# Patient Record
Sex: Male | Born: 1980 | Race: White | Hispanic: No | Marital: Single | Smoking: Never smoker
Health system: Southern US, Community
[De-identification: ages and names within clinical notes are randomized; demographics above are authoritative.]

## PROBLEM LIST (undated history)

## (undated) DIAGNOSIS — F419 Anxiety disorder, unspecified: Secondary | ICD-10-CM

## (undated) DIAGNOSIS — I10 Essential (primary) hypertension: Secondary | ICD-10-CM

## (undated) DIAGNOSIS — M549 Dorsalgia, unspecified: Secondary | ICD-10-CM

## (undated) DIAGNOSIS — F4310 Post-traumatic stress disorder, unspecified: Secondary | ICD-10-CM

## (undated) DIAGNOSIS — G8929 Other chronic pain: Secondary | ICD-10-CM

## (undated) DIAGNOSIS — F32A Depression, unspecified: Secondary | ICD-10-CM

## (undated) DIAGNOSIS — F329 Major depressive disorder, single episode, unspecified: Secondary | ICD-10-CM

---

## 2004-12-01 ENCOUNTER — Emergency Department: Payer: Self-pay | Admitting: Emergency Medicine

## 2005-01-19 ENCOUNTER — Emergency Department: Payer: Self-pay | Admitting: Unknown Physician Specialty

## 2005-08-10 ENCOUNTER — Emergency Department: Payer: Self-pay | Admitting: Emergency Medicine

## 2006-03-29 ENCOUNTER — Emergency Department (HOSPITAL_COMMUNITY): Admission: EM | Admit: 2006-03-29 | Discharge: 2006-03-29 | Payer: Self-pay | Admitting: Emergency Medicine

## 2007-05-03 ENCOUNTER — Emergency Department: Payer: Self-pay | Admitting: Emergency Medicine

## 2008-01-14 ENCOUNTER — Ambulatory Visit (HOSPITAL_COMMUNITY): Admission: RE | Admit: 2008-01-14 | Discharge: 2008-01-14 | Payer: Self-pay | Admitting: Family Medicine

## 2009-08-28 ENCOUNTER — Emergency Department (HOSPITAL_COMMUNITY): Admission: EM | Admit: 2009-08-28 | Discharge: 2009-08-28 | Payer: Self-pay | Admitting: Emergency Medicine

## 2009-08-30 ENCOUNTER — Emergency Department (HOSPITAL_COMMUNITY): Admission: EM | Admit: 2009-08-30 | Discharge: 2009-08-30 | Payer: Self-pay | Admitting: Emergency Medicine

## 2009-11-14 ENCOUNTER — Emergency Department (HOSPITAL_COMMUNITY): Admission: EM | Admit: 2009-11-14 | Discharge: 2009-11-14 | Payer: Self-pay | Admitting: Emergency Medicine

## 2010-06-09 ENCOUNTER — Emergency Department (HOSPITAL_COMMUNITY)
Admission: EM | Admit: 2010-06-09 | Discharge: 2010-06-10 | Disposition: A | Discharge status: Home / Self Care | Payer: No Typology Code available for payment source | Attending: Emergency Medicine | Admitting: Emergency Medicine

## 2010-06-09 DIAGNOSIS — S335XXA Sprain of ligaments of lumbar spine, initial encounter: Secondary | ICD-10-CM | POA: Insufficient documentation

## 2010-06-09 DIAGNOSIS — F988 Other specified behavioral and emotional disorders with onset usually occurring in childhood and adolescence: Secondary | ICD-10-CM | POA: Insufficient documentation

## 2010-06-09 DIAGNOSIS — M722 Plantar fascial fibromatosis: Secondary | ICD-10-CM | POA: Insufficient documentation

## 2010-06-09 DIAGNOSIS — F3289 Other specified depressive episodes: Secondary | ICD-10-CM | POA: Insufficient documentation

## 2010-06-09 DIAGNOSIS — Z79899 Other long term (current) drug therapy: Secondary | ICD-10-CM | POA: Insufficient documentation

## 2010-06-09 DIAGNOSIS — M5380 Other specified dorsopathies, site unspecified: Secondary | ICD-10-CM | POA: Insufficient documentation

## 2010-06-09 DIAGNOSIS — F329 Major depressive disorder, single episode, unspecified: Secondary | ICD-10-CM | POA: Insufficient documentation

## 2010-06-10 ENCOUNTER — Emergency Department (HOSPITAL_COMMUNITY)
Admission: EM | Admit: 2010-06-10 | Discharge: 2010-06-10 | Disposition: A | Discharge status: Home / Self Care | Payer: No Typology Code available for payment source | Attending: Emergency Medicine | Admitting: Emergency Medicine

## 2010-06-10 DIAGNOSIS — S335XXA Sprain of ligaments of lumbar spine, initial encounter: Secondary | ICD-10-CM | POA: Insufficient documentation

## 2010-06-10 DIAGNOSIS — M722 Plantar fascial fibromatosis: Secondary | ICD-10-CM | POA: Insufficient documentation

## 2010-06-10 DIAGNOSIS — Y92410 Unspecified street and highway as the place of occurrence of the external cause: Secondary | ICD-10-CM | POA: Insufficient documentation

## 2010-07-27 ENCOUNTER — Inpatient Hospital Stay (HOSPITAL_COMMUNITY)
Admission: EM | Admit: 2010-07-27 | Discharge: 2010-07-28 | DRG: 607 | Disposition: A | Discharge status: Home / Self Care | Payer: Self-pay | Attending: Internal Medicine | Admitting: Internal Medicine

## 2010-07-27 DIAGNOSIS — L255 Unspecified contact dermatitis due to plants, except food: Principal | ICD-10-CM | POA: Diagnosis present

## 2010-07-27 DIAGNOSIS — F3289 Other specified depressive episodes: Secondary | ICD-10-CM | POA: Diagnosis present

## 2010-07-27 DIAGNOSIS — T622X1A Toxic effect of other ingested (parts of) plant(s), accidental (unintentional), initial encounter: Secondary | ICD-10-CM | POA: Diagnosis present

## 2010-07-27 DIAGNOSIS — F909 Attention-deficit hyperactivity disorder, unspecified type: Secondary | ICD-10-CM | POA: Diagnosis present

## 2010-07-27 DIAGNOSIS — F329 Major depressive disorder, single episode, unspecified: Secondary | ICD-10-CM | POA: Diagnosis present

## 2010-07-27 LAB — COMPREHENSIVE METABOLIC PANEL
AST: 19 U/L (ref 0–37)
Albumin: 3.8 g/dL (ref 3.5–5.2)
Creatinine, Ser: 0.91 mg/dL (ref 0.50–1.35)
GFR calc Af Amer: >60 mL/min (ref >60)
GFR calc non Af Amer: >60 mL/min (ref >60)
Sodium: 138 mEq/L (ref 135–145)
Total Bilirubin: 0.3 mg/dL (ref 0.3–1.2)
Total Protein: 7.5 g/dL (ref 6.0–8.3)

## 2010-07-27 LAB — DIFFERENTIAL
Basophils Absolute: 0 10*3/uL (ref 0.0–0.1)
Eosinophils Absolute: 0.2 10*3/uL (ref 0.0–0.7)
Eosinophils Relative: 2 % (ref 0–5)
Lymphs Abs: 2.7 10*3/uL (ref 0.7–4.0)
Monocytes Absolute: 0.5 10*3/uL (ref 0.1–1.0)
Neutrophils Relative %: 58 % (ref 43–77)

## 2010-07-27 LAB — CBC
Hemoglobin: 14.2 g/dL (ref 13.0–17.0)
MCH: 28 pg (ref 26.0–34.0)
RBC: 5.08 MIL/uL (ref 4.22–5.81)
RDW: 13 % (ref 11.5–15.5)

## 2010-07-28 LAB — LIPID PANEL
HDL: 34 mg/dL — ABNORMAL LOW (ref >39)
LDL Cholesterol: 84 mg/dL (ref 0–99)
Triglycerides: 147 mg/dL (ref <150)

## 2010-07-28 LAB — DIFFERENTIAL
Basophils Absolute: 0 10*3/uL (ref 0.0–0.1)
Eosinophils Relative: 2 % (ref 0–5)
Lymphs Abs: 2.5 10*3/uL (ref 0.7–4.0)
Neutro Abs: 4.3 10*3/uL (ref 1.7–7.7)
Neutrophils Relative %: 57 % (ref 43–77)

## 2010-07-28 LAB — BASIC METABOLIC PANEL
BUN: 10 mg/dL (ref 6–23)
Chloride: 105 mEq/L (ref 96–112)
GFR calc non Af Amer: >60 mL/min (ref >60)
Potassium: 3.9 mEq/L (ref 3.5–5.1)

## 2010-07-28 LAB — CBC
MCH: 27.7 pg (ref 26.0–34.0)
Platelets: 211 10*3/uL (ref 150–400)
RDW: 13.2 % (ref 11.5–15.5)

## 2010-07-29 NOTE — H&P (Signed)
NAME:  Jesse Ellis, Jesse Ellis NO.:  [id number]  MEDICAL RECORD NO.:  [id number]  LOCATION:  A330                          FACILITY:  [facility]  PHYSICIAN:  Vania Rea, M.D. DATE OF BIRTH:  Jan 29, 1981  DATE OF ADMISSION:  07/27/2010 DATE OF DISCHARGE:  LH                             HISTORY & PHYSICAL   PRIMARY CARE PHYSICIAN:  Dr. Shea Evans at [facility] practice in [place].  DICTATING PHYSICIAN:  Dr. Vania Rea.  CHIEF COMPLAINT:  Swelling and redness of the face getting worse over the past 3 days.  HISTORY OF PRESENT ILLNESS:  This is a 30 year old morbidly obese Caucasian gentleman with a history of attention deficit disorder, depression, and anxiety who is cared for by his mother and who was using a "weed whacker" out in the garden about 3 days ago when some dirt flew up and hit him in the face.  Since then the patient has had progressive swelling of the right eye, scalp, and areas of his face.  This morning he started having fever and chills and came to the emergency room to be evaluated and was felt to be having a cellulitis of the face and Hospitalist Service was called to assist with management.  The patient does report there is some itching associated with the rash and swelling of his face.  The patient reports that since being in the emergency room, he seems to have been feeling better since receiving antibiotics.  PAST MEDICAL HISTORY: 1. Recurrent folliculitis. 2. Morbid obesity. 3. Depression and anxiety, questionable bipolar disorder. 4. Attention deficit hyperactivity disorder. 5. Remote history of bleeding, peptic ulcer.  MEDICATIONS: 1. Prozac 20 mg daily. 2. Klonopin 1 mg twice daily.  ALLERGIES:  DEPAKOTE caused him to put on a lot of weight, otherwise no known drug allergies.  SOCIAL HISTORY:  No history of tobacco or illicit drug use.  He used to be an alcohol abuser but after getting information from alcoholics anonymous, he  discontinued alcohol use 3 years ago.  FAMILY HISTORY:  Significant for diabetes and coronary artery disease.  REVIEW OF SYSTEMS:  Other than noted above significant only to the fact his mother says he has been progressively losing weight to dieting.  PHYSICAL EXAMINATION:  GENERAL:  Morbidly obese young Caucasian gentleman sitting up in the stretcher. VITAL SIGNS:  His temperature is 98, his pulse is 72, respiration 20, blood pressure 152/84.  He is saturating at 98% on room air. HEENT:  Eyes:  His pupils are round and equal.  Mucous membranes pink, anicteric.  He is not dehydrated. NECK:  No cervical lymphadenopathy.  No thyromegaly or carotid bruit. He has a very thick neck CHEST:  Clear to auscultation bilaterally. CARDIOVASCULAR SYSTEM:  Regular rhythm.  No murmur. ABDOMEN:  Obese, soft, nontender. EXTREMITIES:  Without edema. SKIN:  On his face he has edema and erythema of his scalp, most in the anterior scalp and patchy bases.  He has right periorbital edema and erythema and also some erythema and swelling of areas of both cheeks and the back of his head, also some erythema to the top of his back.  He also has folliculitis of the back, multiple red  areas on both legs which appeared to be in insect bites.  Also has multiple areas on his trunk which looked like welts.  He has no open wounds or ulcerations. CENTRAL NERVOUS SYSTEM:  Cranial nerves II-XII are grossly intact.  He has no focal lateralizing signs.  LABORATORY DATA:  His white count is 8.1, hemoglobin 14.2, platelets 222,000.  He has a normal differential.  His absolute eosinophil count is normal at 200.  His sodium is 138, potassium 3.7, chloride 100, CO2 31, glucose 95, BUN 10, creatinine 0.91.  His liver functions were completely normal.  His calcium is 9.7.  ASSESSMENT: 1. Questionable cellulitis of the face and scalp. 2. Possible allergic reaction of the face and scalp and his trunk. 3. Morbid obesity. 4.  Attention deficit disorder. 5. Depression.  PLAN:  We will admit this gentleman for continued antibiotic therapy. We will also give empiric treatment for allergies with PEPCID and BENADRYL in view of the fact that we have no documented fever and no leukocytosis.  Further plans will depend on the outcome of his current treatment.  This will be an observation admission for the time being.     Vania Rea, M.D.     LC/MEDQ  D:  07/28/2010  T:  07/28/2010  Job:  [id number]  cc:   Dr. Shea Evans  Electronically Signed by Vania Rea M.D. on 07/29/2010 03:24:19 AM

## 2010-07-29 NOTE — Discharge Summary (Signed)
  NAME:  Jesse Ellis, Jesse Ellis NO.:  [id number]  MEDICAL RECORD NO.:  [id number]  LOCATION:  A330                          FACILITY:  [facility]  PHYSICIAN:  Shatana Saxton L. Lendell Caprice, MDDATE OF BIRTH:  December 05, 1980  DATE OF ADMISSION:  07/27/2010 DATE OF DISCHARGE:  06/17/2012LH                              DISCHARGE SUMMARY   DISCHARGE DIAGNOSES: 1. Contact dermatitis of the face and hands. 2. Obesity 3. Attention deficit disorder.  DISCHARGE MEDICATIONS: 1. Prednisone taper over 3 weeks as directed. 2. Prozac 20 mg a day. 3. Klonopin 1 mg twice a day. 4. Benadryl 50 mg nightly as needed for sleep and itching. 5. Calamine lotion to rash as needed.  CONDITION:  Stable.  ACTIVITY:  Ad lib.  FOLLOWUP:  With his primary care physician next week.  CONSULTATIONS:  None.  DIET:  Regular.  PROCEDURES:  None.  LABORATORY DATA:  CBC normal.  Complete metabolic panel normal.  HISTORY AND HOSPITAL COURSE:  Jesse Ellis is a 30 year old white male who presented with facial swelling.  He was doing yard work using a Air cabin crew several days ago.  Some of the plant material and dirt got into his face.  He was not wearing any projective eye equipment.  He suffered no trauma to the globe.  Since then, his right face has become red, itchy, swollen.  He also developed some rash and itching on his hands and fingers.  He came to the emergency room and initially, there was concern about cellulitis.  He was started on empiric broad-spectrum antibiotics.  I have seen the patient today and he definitely has grouped vesicles, some of which are in a linear distribution, particularly on the hands and fingers.  There is no evidence of cellulitis, but this is consistent with contact dermatitis from poison ivy or poison sumac.  The antibiotics have been stopped and the patient will be started on a prednisone taper, particularly as it is involving his face. He has some erythema and induration of  the periorbital area, but no rash or abnormalities of the globe.  The rash is intensely pleuritic.  The patient can be discharged with outpatient followup.     Modell Fendrick L. Lendell Caprice, MD     CLS/MEDQ  D:  07/28/2010  T:  07/28/2010  Job:  [id number]  Electronically Signed by Crista Curb MD on 07/29/2010 10:50:43 AM

## 2010-08-03 LAB — CULTURE, BLOOD (ROUTINE X 2): Culture: NO GROWTH

## 2011-03-15 ENCOUNTER — Emergency Department (HOSPITAL_COMMUNITY): Payer: Self-pay

## 2011-03-15 ENCOUNTER — Emergency Department (HOSPITAL_COMMUNITY)
Admission: EM | Admit: 2011-03-15 | Discharge: 2011-03-15 | Disposition: A | Discharge status: Home / Self Care | Payer: Self-pay | Attending: Emergency Medicine | Admitting: Emergency Medicine

## 2011-03-15 ENCOUNTER — Encounter (HOSPITAL_COMMUNITY): Payer: Self-pay | Admitting: *Deleted

## 2011-03-15 DIAGNOSIS — R059 Cough, unspecified: Secondary | ICD-10-CM | POA: Insufficient documentation

## 2011-03-15 DIAGNOSIS — G8929 Other chronic pain: Secondary | ICD-10-CM | POA: Insufficient documentation

## 2011-03-15 DIAGNOSIS — R110 Nausea: Secondary | ICD-10-CM | POA: Insufficient documentation

## 2011-03-15 DIAGNOSIS — M549 Dorsalgia, unspecified: Secondary | ICD-10-CM | POA: Insufficient documentation

## 2011-03-15 DIAGNOSIS — Z79899 Other long term (current) drug therapy: Secondary | ICD-10-CM | POA: Insufficient documentation

## 2011-03-15 DIAGNOSIS — F4310 Post-traumatic stress disorder, unspecified: Secondary | ICD-10-CM | POA: Insufficient documentation

## 2011-03-15 DIAGNOSIS — F411 Generalized anxiety disorder: Secondary | ICD-10-CM | POA: Insufficient documentation

## 2011-03-15 DIAGNOSIS — R05 Cough: Secondary | ICD-10-CM | POA: Insufficient documentation

## 2011-03-15 DIAGNOSIS — F3289 Other specified depressive episodes: Secondary | ICD-10-CM | POA: Insufficient documentation

## 2011-03-15 DIAGNOSIS — F329 Major depressive disorder, single episode, unspecified: Secondary | ICD-10-CM | POA: Insufficient documentation

## 2011-03-15 DIAGNOSIS — J3489 Other specified disorders of nose and nasal sinuses: Secondary | ICD-10-CM | POA: Insufficient documentation

## 2011-03-15 DIAGNOSIS — B349 Viral infection, unspecified: Secondary | ICD-10-CM

## 2011-03-15 DIAGNOSIS — J029 Acute pharyngitis, unspecified: Secondary | ICD-10-CM | POA: Insufficient documentation

## 2011-03-15 HISTORY — DX: Post-traumatic stress disorder, unspecified: F43.10

## 2011-03-15 HISTORY — DX: Anxiety disorder, unspecified: F41.9

## 2011-03-15 HISTORY — DX: Other chronic pain: G89.29

## 2011-03-15 HISTORY — DX: Major depressive disorder, single episode, unspecified: F32.9

## 2011-03-15 HISTORY — DX: Depression, unspecified: F32.A

## 2011-03-15 HISTORY — DX: Dorsalgia, unspecified: M54.9

## 2011-03-15 LAB — RAPID STREP SCREEN (MED CTR MEBANE ONLY): Streptococcus, Group A Screen (Direct): NEGATIVE

## 2011-03-15 MED ORDER — KETOROLAC TROMETHAMINE 30 MG/ML IJ SOLN
30.0000 mg | Freq: Once | INTRAMUSCULAR | Status: AC
  Administered 2011-03-15: 30 mg via INTRAVENOUS
  Filled 2011-03-15: qty 1

## 2011-03-15 MED ORDER — ACETAMINOPHEN 500 MG PO TABS
1000.0000 mg | ORAL_TABLET | Freq: Once | ORAL | Status: AC
  Administered 2011-03-15: 1000 mg via ORAL
  Filled 2011-03-15: qty 2

## 2011-03-15 MED ORDER — ONDANSETRON HCL 4 MG/2ML IJ SOLN
4.0000 mg | Freq: Once | INTRAMUSCULAR | Status: AC
  Administered 2011-03-15: 4 mg via INTRAVENOUS
  Filled 2011-03-15: qty 2

## 2011-03-15 MED ORDER — IBUPROFEN 800 MG PO TABS: 800.0000 mg | ORAL_TABLET | Freq: Three times a day (TID) | ORAL | Status: AC

## 2011-03-15 MED ORDER — SODIUM CHLORIDE 0.9 % IV BOLUS (SEPSIS)
1000.0000 mL | Freq: Once | INTRAVENOUS | Status: AC
  Administered 2011-03-15: 1000 mL via INTRAVENOUS

## 2011-03-15 MED ORDER — DIPHENHYD-HYDROCORT-NYSTATIN MT SUSP: OROMUCOSAL | Status: DC

## 2011-03-15 NOTE — ED Notes (Signed)
Sore throat, weakness, nonproductive cough. Nausea times. Denies vomiting. Headahce. Ear pain. Symptoms began 6 days ago.

## 2011-03-15 NOTE — ED Provider Notes (Signed)
Patient has had a sore throat for a week and feeling bad.   Patient's speech is normal. Oropharynx is normal, there is no erythema of his tonsils, or posterior pharynx. Mucous membranes appear moist after treatment.  Medical screening examination/treatment/procedure(s) were conducted as a shared visit with non-physician practitioner(s) and myself.  I personally evaluated the patient during the encounter Devoria Albe, MD, Franz Dell, MD 03/15/11 1329

## 2011-04-02 NOTE — ED Provider Notes (Signed)
History     CSN: [id number]  Arrival date & time 03/15/11  0865   First MD Initiated Contact with Patient 03/15/11 1013      Chief Complaint  Patient presents with  . Sore Throat    (Consider location/radiation/quality/duration/timing/severity/associated sxs/prior treatment) Patient is a 31 y.o. male presenting with pharyngitis. The history is provided by the patient. No language interpreter was used.  Sore Throat This is a new problem. The current episode started in the past 7 days. The problem occurs constantly. The problem has been unchanged. Associated symptoms include congestion, coughing, nausea and a sore throat. Pertinent negatives include no abdominal pain, chest pain, chills, fever, headaches, myalgias, neck pain, numbness, rash, swollen glands, vomiting or weakness. The symptoms are aggravated by swallowing. He has tried nothing for the symptoms. The treatment provided no relief.    Past Medical History  Diagnosis Date  . Chronic back pain     "chronic skeletal problems"  . PTSD (post-traumatic stress disorder)   . Anxiety   . Depression     History reviewed. No pertinent past surgical history.  No family history on file.  History  Substance Use Topics  . Smoking status: Never Smoker   . Smokeless tobacco: Not on file  . Alcohol Use: No      Review of Systems  Constitutional: Negative for fever, chills, activity change and appetite change.  HENT: Positive for congestion and sore throat. Negative for ear pain, trouble swallowing, neck pain and neck stiffness.   Respiratory: Positive for cough. Negative for shortness of breath, wheezing and stridor.   Cardiovascular: Negative for chest pain.  Gastrointestinal: Positive for nausea. Negative for vomiting and abdominal pain.  Genitourinary: Negative for difficulty urinating.  Musculoskeletal: Negative for myalgias.  Skin: Negative for rash.  Neurological: Negative for dizziness, weakness, numbness and  headaches.  All other systems reviewed and are negative.    Allergies  Review of patient's allergies indicates not on file.  Home Medications   Current Outpatient Rx  Name Route Sig Dispense Refill  . CLONAZEPAM 1 MG PO TABS Oral Take 1 mg by mouth at bedtime.    . FLUOXETINE HCL 40 MG PO CAPS Oral Take 40 mg by mouth daily.    . OXYCODONE HCL ER 10 MG PO TB12 Oral Take 10 mg by mouth 3 (three) times daily.    Marland Kitchen PROMETHAZINE HCL 25 MG PO TABS Oral Take 25 mg by mouth every 6 (six) hours as needed. nausea    . DIPHENHYD-HYDROCORT-NYSTATIN MT SUSP  5 ml po swish and spit po TID prn 120 mL 0    BP 131/73  Pulse 91  Temp(Src) 99.9 F (37.7 C) (Oral)  Resp 20  Ht 6\' 11"  (2.108 m)  Wt 312 lb (141.522 kg)  BMI 31.84 kg/m2  SpO2 94%  Physical Exam  Nursing note and vitals reviewed. Constitutional: He is oriented to person, place, and time. He appears well-developed and well-nourished. No distress.  HENT:  Head: Normocephalic and atraumatic. No trismus in the jaw.  Right Ear: Tympanic membrane and ear canal normal.  Left Ear: Tympanic membrane and ear canal normal.  Mouth/Throat: Uvula is midline and mucous membranes are normal. No uvula swelling. Posterior oropharyngeal erythema present. No oropharyngeal exudate, posterior oropharyngeal edema or tonsillar abscesses.  Neck: Normal range of motion. Neck supple.  Cardiovascular: Normal rate, regular rhythm and normal heart sounds.   Pulmonary/Chest: Effort normal and breath sounds normal. No respiratory distress. He exhibits no tenderness.  Musculoskeletal: Normal range of motion. He exhibits no edema and no tenderness.  Lymphadenopathy:    He has no cervical adenopathy.  Neurological: He is alert and oriented to person, place, and time. He exhibits normal muscle tone. Coordination normal.  Skin: Skin is warm and dry.    ED Course  Procedures (including critical care time)   Results for orders placed during the hospital  encounter of 03/15/11  RAPID STREP SCREEN      Component Value Range   Streptococcus, Group A Screen (Direct) NEGATIVE  NEGATIVE      1. Pharyngitis   2. Viral illness       MDM     Patient is alert, NAD.  Non-toxic appearing.  Mild erythema of the oropharynx w/o exudates, swelling, no trismus.  Likely viral illness.  Pt agrees to close f/u with his PCP  Patient / Family / Caregiver understand and agree with initial ED impression and plan with expectations set for ED visit. Pt stable in ED with no significant deterioration in condition.      Titiana Severa L. Nikisha Fleece, [place] 04/02/11 1502

## 2011-04-02 NOTE — ED Provider Notes (Signed)
Medical screening examination/treatment/procedure(s) were performed by non-physician practitioner and as supervising physician I was immediately available for consultation/collaboration. Arcelia Pals, MD, FACEP   Aziah Kaiser L Mertie Haslem, MD 04/02/11 2324 

## 2011-06-25 ENCOUNTER — Encounter (HOSPITAL_COMMUNITY): Payer: Self-pay | Admitting: *Deleted

## 2011-06-25 ENCOUNTER — Emergency Department (HOSPITAL_COMMUNITY)
Admission: EM | Admit: 2011-06-25 | Discharge: 2011-06-25 | Disposition: A | Discharge status: Home / Self Care | Payer: Self-pay | Attending: Emergency Medicine | Admitting: Emergency Medicine

## 2011-06-25 ENCOUNTER — Emergency Department (HOSPITAL_COMMUNITY): Payer: Self-pay

## 2011-06-25 DIAGNOSIS — S93409A Sprain of unspecified ligament of unspecified ankle, initial encounter: Secondary | ICD-10-CM | POA: Insufficient documentation

## 2011-06-25 DIAGNOSIS — G8929 Other chronic pain: Secondary | ICD-10-CM | POA: Insufficient documentation

## 2011-06-25 DIAGNOSIS — M549 Dorsalgia, unspecified: Secondary | ICD-10-CM | POA: Insufficient documentation

## 2011-06-25 DIAGNOSIS — X58XXXA Exposure to other specified factors, initial encounter: Secondary | ICD-10-CM | POA: Insufficient documentation

## 2011-06-25 DIAGNOSIS — I10 Essential (primary) hypertension: Secondary | ICD-10-CM | POA: Insufficient documentation

## 2011-06-25 DIAGNOSIS — M25579 Pain in unspecified ankle and joints of unspecified foot: Secondary | ICD-10-CM | POA: Insufficient documentation

## 2011-06-25 DIAGNOSIS — Z79899 Other long term (current) drug therapy: Secondary | ICD-10-CM | POA: Insufficient documentation

## 2011-06-25 DIAGNOSIS — IMO0002 Reserved for concepts with insufficient information to code with codable children: Secondary | ICD-10-CM | POA: Insufficient documentation

## 2011-06-25 DIAGNOSIS — S93401A Sprain of unspecified ligament of right ankle, initial encounter: Secondary | ICD-10-CM

## 2011-06-25 DIAGNOSIS — M25476 Effusion, unspecified foot: Secondary | ICD-10-CM | POA: Insufficient documentation

## 2011-06-25 DIAGNOSIS — M25473 Effusion, unspecified ankle: Secondary | ICD-10-CM | POA: Insufficient documentation

## 2011-06-25 DIAGNOSIS — F341 Dysthymic disorder: Secondary | ICD-10-CM | POA: Insufficient documentation

## 2011-06-25 HISTORY — DX: Essential (primary) hypertension: I10

## 2011-06-25 MED ORDER — OXYCODONE-ACETAMINOPHEN 5-325 MG PO TABS
1.0000 | ORAL_TABLET | Freq: Once | ORAL | Status: AC
  Administered 2011-06-25: 1 via ORAL
  Filled 2011-06-25: qty 1

## 2011-06-25 NOTE — Discharge Instructions (Signed)
Ankle Sprain An ankle sprain is an injury to the strong, fibrous tissues (ligaments) that hold the bones of your ankle joint together.  CAUSES Ankle sprain usually is caused by a fall or by twisting your ankle. People who participate in sports are more prone to these types of injuries.  SYMPTOMS  Symptoms of ankle sprain include:  Pain in your ankle. The pain may be present at rest or only when you are trying to stand or walk.   Swelling.   Bruising. Bruising may develop immediately or within 1 to 2 days after your injury.   Difficulty standing or walking.  DIAGNOSIS  Your caregiver will ask you details about your injury and perform a physical exam of your ankle to determine if you have an ankle sprain. During the physical exam, your caregiver will press and squeeze specific areas of your foot and ankle. Your caregiver will try to move your ankle in certain ways. An X-ray exam may be done to be sure a bone was not broken or a ligament did not separate from one of the bones in your ankle (avulsion).  TREATMENT  Certain types of braces can help stabilize your ankle. Your caregiver can make a recommendation for this. Your caregiver may recommend the use of medication for pain. If your sprain is severe, your caregiver may refer you to a surgeon who helps to restore function to parts of your skeletal system (orthopedist) or a physical therapist. HOME CARE INSTRUCTIONS  Apply ice to your injury for 1 to 2 days or as directed by your caregiver. Applying ice helps to reduce inflammation and pain.  Put ice in a plastic bag.   Place a towel between your skin and the bag.   Leave the ice on for 15 to 20 minutes at a time, every 2 hours while you are awake.   Take over-the-counter or prescription medicines for pain, discomfort, or fever only as directed by your caregiver.   Keep your injured leg elevated, when possible, to lessen swelling.   If your caregiver recommends crutches, use them as  instructed. Gradually, put weight on the affected ankle. Continue to use crutches or a cane until you can walk without feeling pain in your ankle.   If you have a plaster splint, wear the splint as directed by your caregiver. Do not rest it on anything harder than a pillow the first 24 hours. Do not put weight on it. Do not get it wet. You may take it off to take a shower or bath.   You may have been given an elastic bandage to wear around your ankle to provide support. If the elastic bandage is too tight (you have numbness or tingling in your foot or your foot becomes cold and blue), adjust the bandage to make it comfortable.   If you have an air splint, you may blow more air into it or let air out to make it more comfortable. You may take your splint off at night and before taking a shower or bath.   Wiggle your toes in the splint several times per day if you are able.  SEEK MEDICAL CARE IF:   You have an increase in bruising, swelling, or pain.   Your toes feel cold.   Pain relief is not achieved with medication.  SEEK IMMEDIATE MEDICAL CARE IF: Your toes are numb or blue or you have severe pain. MAKE SURE YOU:   Understand these instructions.   Will watch your condition.     Will get help right away if you are not doing well or get worse.  Document Released: 01/27/2005 Document Revised: 01/16/2011 Document Reviewed: 09/01/2007 [facility] Patient Information 2012 [place], [place].Cryotherapy Cryotherapy means treatment with cold. Ice or gel packs can be used to reduce both pain and swelling. Ice is the most helpful within the first 24 to 48 hours after an injury or flareup from overusing a muscle or joint. Sprains, strains, spasms, burning pain, shooting pain, and aches can all be eased with ice. Ice can also be used when recovering from surgery. Ice is effective, has very few side effects, and is safe for most people to use. PRECAUTIONS  Ice is not a safe treatment option for people  with:  Raynaud's phenomenon. This is a condition affecting small blood vessels in the extremities. Exposure to cold may cause your problems to return.   Cold hypersensitivity. There are many forms of cold hypersensitivity, including:   Cold urticaria. Red, itchy hives appear on the skin when the tissues begin to warm after being iced.   Cold erythema. This is a red, itchy rash caused by exposure to cold.   Cold hemoglobinuria. Red blood cells break down when the tissues begin to warm after being iced. The hemoglobin that carry oxygen are passed into the urine because they cannot combine with blood proteins fast enough.   Numbness or altered sensitivity in the area being iced.  If you have any of the following conditions, do not use ice until you have discussed cryotherapy with your caregiver:  Heart conditions, such as arrhythmia, angina, or chronic heart disease.   High blood pressure.   Healing wounds or open skin in the area being iced.   Current infections.   Rheumatoid arthritis.   Poor circulation.   Diabetes.  Ice slows the blood flow in the region it is applied. This is beneficial when trying to stop inflamed tissues from spreading irritating chemicals to surrounding tissues. However, if you expose your skin to cold temperatures for too long or without the proper protection, you can damage your skin or nerves. Watch for signs of skin damage due to cold. HOME CARE INSTRUCTIONS Follow these tips to use ice and cold packs safely.  Place a dry or damp towel between the ice and skin. A damp towel will cool the skin more quickly, so you may need to shorten the time that the ice is used.   For a more rapid response, add gentle compression to the ice.   Ice for no more than 10 to 20 minutes at a time. The bonier the area you are icing, the less time it will take to get the benefits of ice.   Check your skin after 5 minutes to make sure there are no signs of a poor response to  cold or skin damage.   Rest 20 minutes or more in between uses.   Once your skin is numb, you can end your treatment. You can test numbness by very lightly touching your skin. The touch should be so light that you do not see the skin dimple from the pressure of your fingertip. When using ice, most people will feel these normal sensations in this order: cold, burning, aching, and numbness.   Do not use ice on someone who cannot communicate their responses to pain, such as small children or people with dementia.  HOW TO MAKE AN ICE PACK Ice packs are the most common way to use ice therapy. Other methods include ice  massage, ice baths, and cryo-sprays. Muscle creams that cause a cold, tingly feeling do not offer the same benefits that ice offers and should not be used as a substitute unless recommended by your caregiver. To make an ice pack, do one of the following:  Place crushed ice or a bag of frozen vegetables in a sealable plastic bag. Squeeze out the excess air. Place this bag inside another plastic bag. Slide the bag into a pillowcase or place a damp towel between your skin and the bag.   Mix 3 parts water with 1 part rubbing alcohol. Freeze the mixture in a sealable plastic bag. When you remove the mixture from the freezer, it will be slushy. Squeeze out the excess air. Place this bag inside another plastic bag. Slide the bag into a pillowcase or place a damp towel between your skin and the bag.  SEEK MEDICAL CARE IF:  You develop white spots on your skin. This may give the skin a blotchy (mottled) appearance.   Your skin turns blue or pale.   Your skin becomes waxy or hard.   Your swelling gets worse.  MAKE SURE YOU:   Understand these instructions.   Will watch your condition.   Will get help right away if you are not doing well or get worse.  Document Released: 09/23/2010 Document Revised: 01/16/2011 Document Reviewed: 09/23/2010 [facility] Patient Information 2012 [place],  [place].Crutch Use You have been prescribed crutches to take weight off one of your lower legs or feet (extremities). When using crutches, make sure you are not putting pressure on the armpit (axilla). This could cause damage to the nerves that extend from your axilla to the hand and arm. When fitted properly the crutches should be 2 to 3 finger widths below the axilla. Your weight should be supported by your hand, and not by resting upon the crutch with the axilla. When walking, first step with the crutches, then swing the healthy leg through and slightly ahead. When going up stairs, first step up with the healthy leg and then follow with the crutches and injured leg up to the same step, and so forth. If there is a handrail, hold both crutches in one hand, place your other hand on the handrail, and while placing your weight on your arms, lift your good leg to the step, then bring the crutches and the injured leg up to that step. Repeat for each step. When going down stairs, first step with the injured leg and crutches, following down with the healthy leg to the same step. Be very careful, as going down stairs with crutches is very challenging. If you feel wobbly or nervous, sit down and inch yourself down the stairs on your butt. To get up from a chair, hold injured leg forward, grab armrest with one hand and the top of the crutches with the other hand. Using these supports, pull yourself up to a standing position. Reverse this procedure for sitting. See your caregiver for follow up as suggested. If you are discharged in an ace wrap and develop numbness, tingling, swelling, or increased pain, loosen the ace wrap and re-wrap looser. If these problems persist, see your caregiver as needed. If you have been instructed to use partial weight bearing, bear (apply) the amount of weight as suggested by your caregiver. Do not bear weight in an amount that causes pain on the area of injury. Document Released: 01/25/2000  Document Revised: 01/16/2011 Document Reviewed: 04/03/2008 [facility] Patient Information 2012 [place], [place].  Use the crutches as needed.  Bear weight as tolerated.  Apply ice several times daily and elevate as much as possible.  Take the pain med as directed and ibuprofen 800 mg every 8 hrs with food.  Follow up with your orthopedist in [place] as needed.

## 2011-06-25 NOTE — ED Notes (Signed)
Pt reports tripping into a ditch yesterday afternoon and twisting right ankle.  Pt reports continued pain, despite taking oxycodone.  Denies numbness or tingling at present.
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
## 2011-06-26 NOTE — ED Provider Notes (Signed)
Medical screening examination/treatment/procedure(s) were performed by non-physician practitioner and as supervising physician I was immediately available for consultation/collaboration.   Jil Penland, MD 06/26/11 0056 

## 2012-06-26 IMAGING — CR DG CHEST 2V
2 series · 2 of 2 positions shown · non-contrast
Comparison: None.

CLINICAL DATA: Cough, fever, sore throat

CHEST - 2 VIEW

[view not recorded (1 of 2)]
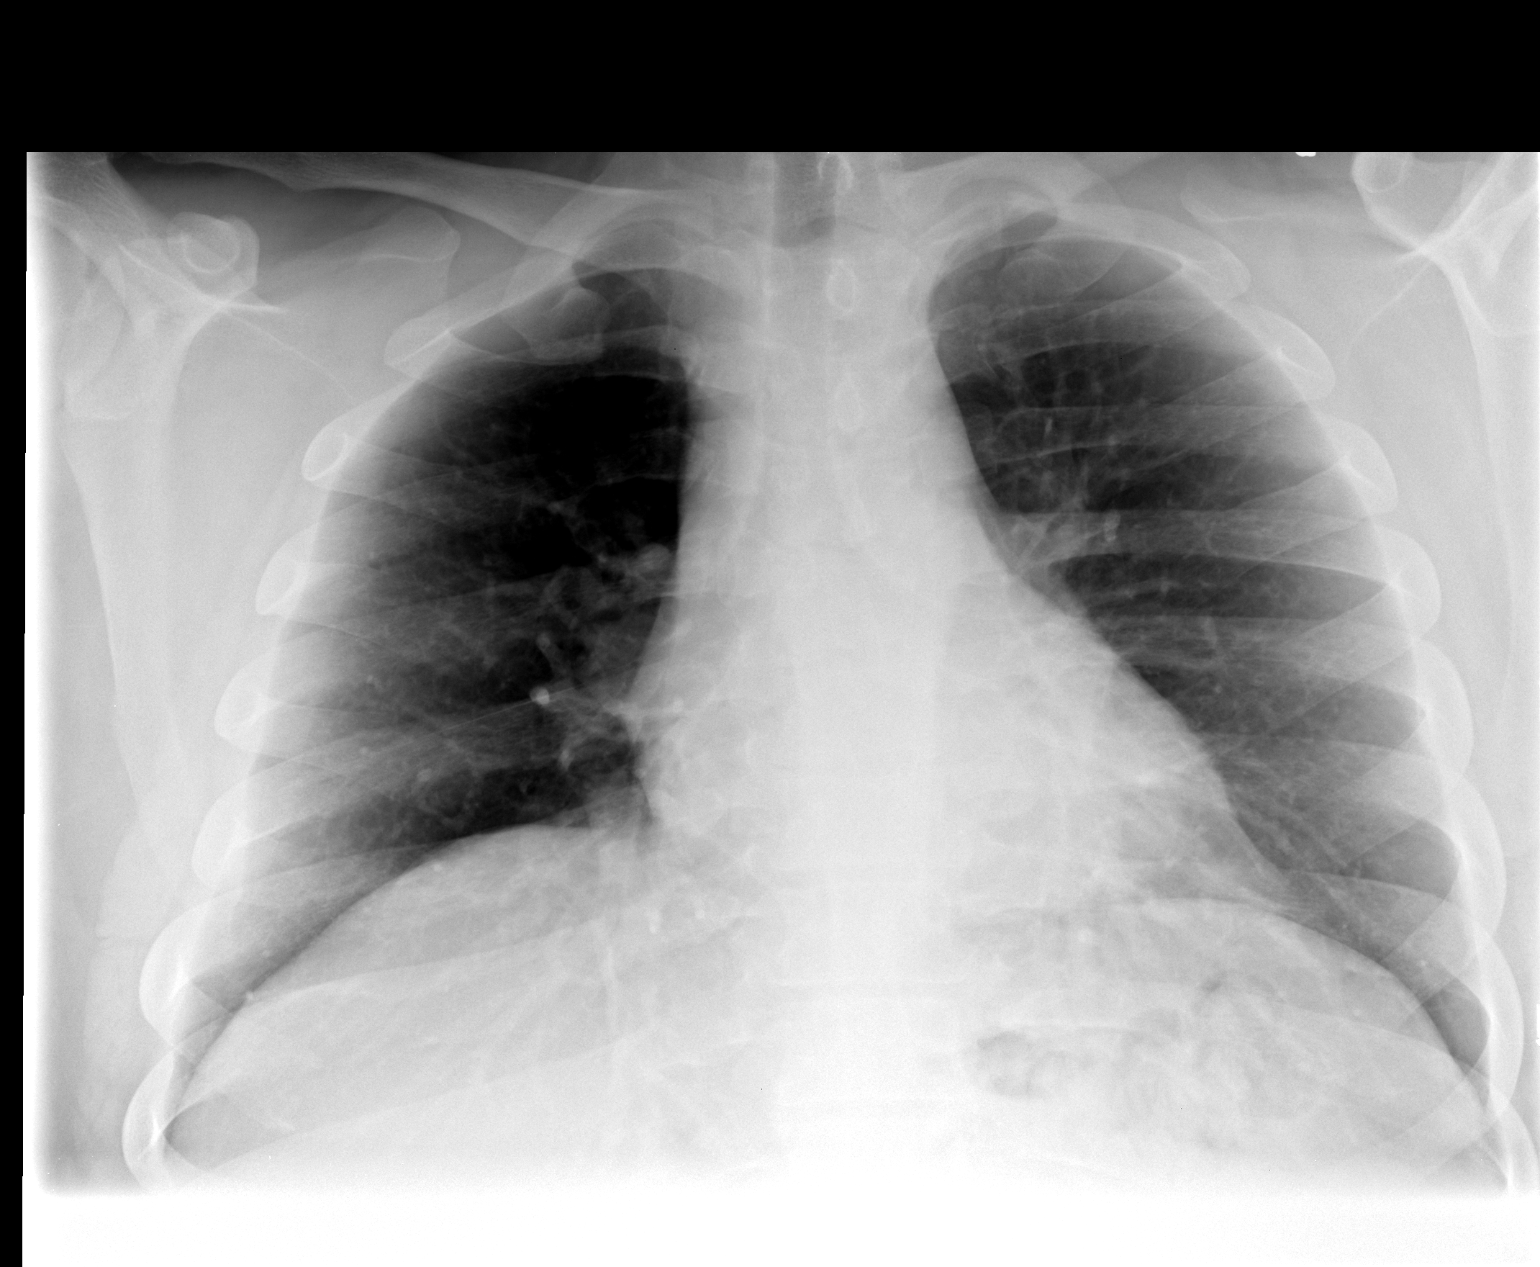

[view not recorded (2 of 2)]
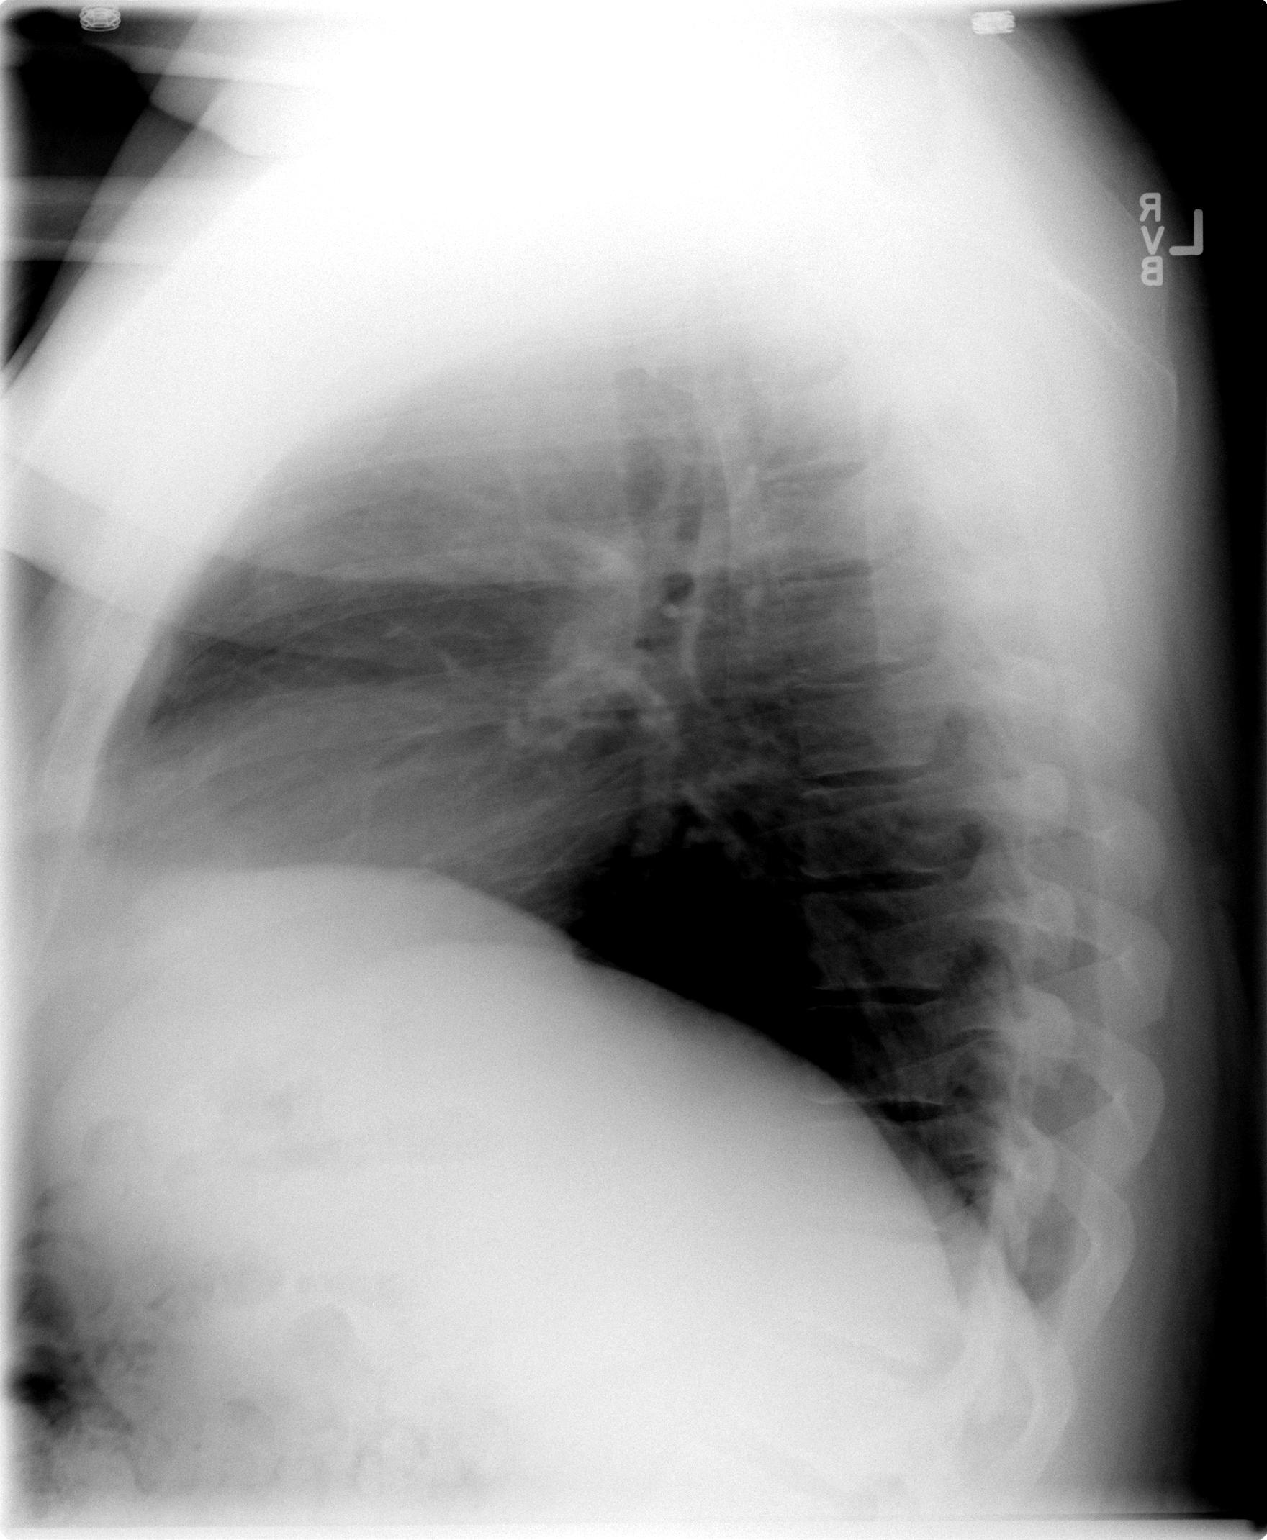

[2 of 2 positions shown; findings below may reference images not displayed]

FINDINGS: The cardiac silhouette, mediastinum, pulmonary
vasculature are within normal limits.  Both lungs are clear.
There is no acute bony abnormality.
IMPRESSION: There is no evidence of acute cardiac or pulmonary process.

## 2012-10-08 ENCOUNTER — Encounter (HOSPITAL_COMMUNITY): Payer: Self-pay | Admitting: *Deleted

## 2012-10-08 ENCOUNTER — Emergency Department (HOSPITAL_COMMUNITY)
Admission: EM | Admit: 2012-10-08 | Discharge: 2012-10-08 | Disposition: A | Discharge status: Home / Self Care | Payer: Medicaid Other | Attending: Emergency Medicine | Admitting: Emergency Medicine

## 2012-10-08 DIAGNOSIS — I10 Essential (primary) hypertension: Secondary | ICD-10-CM | POA: Insufficient documentation

## 2012-10-08 DIAGNOSIS — Y939 Activity, unspecified: Secondary | ICD-10-CM | POA: Insufficient documentation

## 2012-10-08 DIAGNOSIS — Z79899 Other long term (current) drug therapy: Secondary | ICD-10-CM | POA: Insufficient documentation

## 2012-10-08 DIAGNOSIS — G8929 Other chronic pain: Secondary | ICD-10-CM | POA: Insufficient documentation

## 2012-10-08 DIAGNOSIS — F329 Major depressive disorder, single episode, unspecified: Secondary | ICD-10-CM | POA: Insufficient documentation

## 2012-10-08 DIAGNOSIS — R450 Nervousness: Secondary | ICD-10-CM | POA: Insufficient documentation

## 2012-10-08 DIAGNOSIS — M549 Dorsalgia, unspecified: Secondary | ICD-10-CM | POA: Insufficient documentation

## 2012-10-08 DIAGNOSIS — L255 Unspecified contact dermatitis due to plants, except food: Secondary | ICD-10-CM | POA: Insufficient documentation

## 2012-10-08 DIAGNOSIS — F4310 Post-traumatic stress disorder, unspecified: Secondary | ICD-10-CM | POA: Insufficient documentation

## 2012-10-08 DIAGNOSIS — IMO0001 Reserved for inherently not codable concepts without codable children: Secondary | ICD-10-CM | POA: Insufficient documentation

## 2012-10-08 DIAGNOSIS — F3289 Other specified depressive episodes: Secondary | ICD-10-CM | POA: Insufficient documentation

## 2012-10-08 DIAGNOSIS — F411 Generalized anxiety disorder: Secondary | ICD-10-CM | POA: Insufficient documentation

## 2012-10-08 DIAGNOSIS — T7840XA Allergy, unspecified, initial encounter: Secondary | ICD-10-CM

## 2012-10-08 DIAGNOSIS — Y929 Unspecified place or not applicable: Secondary | ICD-10-CM | POA: Insufficient documentation

## 2012-10-08 MED ORDER — FAMOTIDINE 20 MG PO TABS
20.0000 mg | ORAL_TABLET | Freq: Once | ORAL | Status: AC
  Administered 2012-10-08: 20 mg via ORAL
  Filled 2012-10-08: qty 1

## 2012-10-08 MED ORDER — PREDNISONE 10 MG PO TABS: ORAL_TABLET | ORAL | Status: DC

## 2012-10-08 MED ORDER — HYDROXYZINE HCL 25 MG PO TABS
50.0000 mg | ORAL_TABLET | Freq: Once | ORAL | Status: AC
  Administered 2012-10-08: 50 mg via ORAL
  Filled 2012-10-08: qty 2

## 2012-10-08 MED ORDER — HYDROXYZINE HCL 25 MG PO TABS: 25.0000 mg | ORAL_TABLET | Freq: Four times a day (QID) | ORAL | Status: DC

## 2012-10-08 MED ORDER — METHYLPREDNISOLONE SODIUM SUCC 125 MG IJ SOLR
125.0000 mg | Freq: Once | INTRAMUSCULAR | Status: AC
  Administered 2012-10-08: 125 mg via INTRAMUSCULAR
  Filled 2012-10-08: qty 2

## 2012-10-08 NOTE — ED Notes (Signed)
Pt has had itching red rash for 4-5 days, Has been working outside cutting trees.  Marland Kitchen Has had rash from poison sumac in past.

## 2012-10-08 NOTE — ED Provider Notes (Signed)
CSN: [id number]     Arrival date & time 10/08/12  1600 History   First MD Initiated Contact with Patient 10/08/12 1634     Chief Complaint  Patient presents with  . Rash   (Consider location/radiation/quality/duration/timing/severity/associated sxs/prior Treatment) Patient is a 32 y.o. male presenting with rash. The history is provided by the patient and a parent.  Rash Location:  Head/neck, face, torso, leg and hand Head/neck rash location:  Head and scalp (posterior neck.) Facial rash location:  Face Hand rash location:  L hand and R hand Torso rash location:  Upper back and lower back Leg rash location:  L leg and R leg Severity:  Moderate Onset quality:  Gradual Duration:  3 days Timing:  Intermittent Progression:  Worsening Chronicity:  Recurrent Context: plant contact and sun exposure   Relieved by:  Nothing Associated symptoms: no abdominal pain, no joint pain, no shortness of breath, no sore throat, no throat swelling, no tongue swelling and not wheezing     Past Medical History  Diagnosis Date  . Chronic back pain     "chronic skeletal problems"  . PTSD (post-traumatic stress disorder)   . Anxiety   . Depression   . Hypertension    History reviewed. No pertinent past surgical history. No family history on file. History  Substance Use Topics  . Smoking status: Never Smoker   . Smokeless tobacco: Not on file  . Alcohol Use: No    Review of Systems  Constitutional: Negative for activity change.       All ROS Neg except as noted in HPI  HENT: Negative for nosebleeds, sore throat and neck pain.   Eyes: Negative for photophobia and discharge.  Respiratory: Negative for cough, shortness of breath and wheezing.   Cardiovascular: Negative for chest pain and palpitations.  Gastrointestinal: Negative for abdominal pain and blood in stool.  Genitourinary: Negative for dysuria, frequency and hematuria.  Musculoskeletal: Positive for back pain. Negative for  arthralgias.  Skin: Positive for rash.  Neurological: Negative for dizziness, seizures and speech difficulty.  Psychiatric/Behavioral: Negative for hallucinations and confusion. The patient is nervous/anxious.        Depression    Allergies  Review of patient's allergies indicates no known allergies.  Home Medications   Current Outpatient Rx  Name  Route  Sig  Dispense  Refill  . clonazePAM (KLONOPIN) 1 MG tablet   Oral   Take 1 mg by mouth at bedtime.         Marland Kitchen FLUoxetine (PROZAC) 40 MG capsule   Oral   Take 40 mg by mouth daily.         Marland Kitchen lisinopril (PRINIVIL,ZESTRIL) 10 MG tablet   Oral   Take 10 mg by mouth daily.         Marland Kitchen oxyCODONE (OXYCONTIN) 10 MG 12 hr tablet   Oral   Take 10 mg by mouth 3 (three) times daily.          BP 145/80  Pulse 90  Temp(Src) 98.6 F (37 C) (Oral)  Resp 20  Ht 5\' 11"  (1.803 m)  Wt 336 lb (152.409 kg)  BMI 46.88 kg/m2  SpO2 100% Physical Exam  Nursing note and vitals reviewed. Constitutional: He is oriented to person, place, and time. He appears well-developed and well-nourished.  Non-toxic appearance.  HENT:  Head: Normocephalic.  Right Ear: Tympanic membrane and external ear normal.  Left Ear: Tympanic membrane and external ear normal.  There is mild to moderate increased  redness of the face. There is some redness of the scalp. There no hot areas of the face superior orbital area.  Eyes: EOM and lids are normal. Pupils are equal, round, and reactive to light.  Neck: Normal range of motion. Neck supple. Carotid bruit is not present. No tracheal deviation present.  Cardiovascular: Normal rate, regular rhythm, normal heart sounds, intact distal pulses and normal pulses.   Pulmonary/Chest: Breath sounds normal. No respiratory distress. He has no wheezes.  Patient speaks in complete sentences.  Abdominal: Soft. Bowel sounds are normal. There is no tenderness. There is no guarding.  Musculoskeletal: Normal range of motion.  No  rash noted of the palms of the right or left pain.  Lymphadenopathy:       Head (right side): No submandibular adenopathy present.       Head (left side): No submandibular adenopathy present.    He has no cervical adenopathy.  Neurological: He is alert and oriented to person, place, and time. He has normal strength. No cranial nerve deficit or sensory deficit.  Skin: Skin is warm and dry.  There is a red rash noted of the posterior neck. There are individual red raised bumps on the upper back and on the lower abdomen . There is a red rash extending from the pubis into the perineum area. There is a red streaking type rash of both arms and both legs.  Psychiatric: He has a normal mood and affect. His speech is normal.    ED Course  Procedures (including critical care time) Labs Review Labs Reviewed - No data to display Imaging Review No results found. Pulse Ox 100% on room air. WNL by my interpretation. MDM  No diagnosis found. *I have reviewed nursing notes, vital signs, and all appropriate lab and imaging results for this patient.**  Patient's mother states that the patient has been admitted to the hospital because of" allergic reaction to poison sumac" in the past. She states that at that time the patient's eyes were swollen shut and the rash was off or red and angry looking. The patient denies any wheezing, difficulty swallowing, difficulty speaking, or shortness of breath. He does admit to feeling that the rash is moving and spreading, particularly at the face and scalp area.  Patient treated with oral Pepcid and Atarax. Patient also treated with intramuscular Solu-Medrol. Will observe patient and repeat vital signs including pulse oximetry. Pulse ox remains stable. Recheck reveal the rash of the face and scalp improved. Rash on the upper and lower extremities less red, but still present. Pt speaks in complete sentences. Itching improved but not resolved. No wheezing noted. Plan - Rx  for Atarax, and prednisone taper. Pt will also use pepcid daily. They will return if any changes or problems.  Kathie Dike, PA-C 10/08/12 [place]
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
## 2012-10-09 NOTE — ED Provider Notes (Signed)
Medical screening examination/treatment/procedure(s) were performed by non-physician practitioner and as supervising physician I was immediately available for consultation/collaboration.    Nia Nathaniel D Fiona Coto, MD 10/09/12 1755 

## 2012-12-24 ENCOUNTER — Emergency Department (HOSPITAL_COMMUNITY)
Admission: EM | Admit: 2012-12-24 | Discharge: 2012-12-24 | Disposition: A | Discharge status: Home / Self Care | Payer: Medicare Other | Attending: Emergency Medicine | Admitting: Emergency Medicine

## 2012-12-24 ENCOUNTER — Encounter (HOSPITAL_COMMUNITY): Payer: Self-pay | Admitting: Emergency Medicine

## 2012-12-24 DIAGNOSIS — G8929 Other chronic pain: Secondary | ICD-10-CM | POA: Insufficient documentation

## 2012-12-24 DIAGNOSIS — M7989 Other specified soft tissue disorders: Secondary | ICD-10-CM | POA: Insufficient documentation

## 2012-12-24 DIAGNOSIS — F411 Generalized anxiety disorder: Secondary | ICD-10-CM | POA: Insufficient documentation

## 2012-12-24 DIAGNOSIS — I10 Essential (primary) hypertension: Secondary | ICD-10-CM | POA: Insufficient documentation

## 2012-12-24 DIAGNOSIS — M25539 Pain in unspecified wrist: Secondary | ICD-10-CM | POA: Insufficient documentation

## 2012-12-24 DIAGNOSIS — F3289 Other specified depressive episodes: Secondary | ICD-10-CM | POA: Insufficient documentation

## 2012-12-24 DIAGNOSIS — F329 Major depressive disorder, single episode, unspecified: Secondary | ICD-10-CM | POA: Insufficient documentation

## 2012-12-24 DIAGNOSIS — Z79899 Other long term (current) drug therapy: Secondary | ICD-10-CM | POA: Insufficient documentation

## 2012-12-24 DIAGNOSIS — F4310 Post-traumatic stress disorder, unspecified: Secondary | ICD-10-CM | POA: Insufficient documentation

## 2012-12-24 DIAGNOSIS — M79631 Pain in right forearm: Secondary | ICD-10-CM

## 2012-12-24 LAB — CBC WITH DIFFERENTIAL/PLATELET
Basophils Absolute: 0 10*3/uL (ref 0.0–0.1)
Basophils Relative: 0 % (ref 0–1)
Eosinophils Absolute: 0.2 10*3/uL (ref 0.0–0.7)
Hemoglobin: 13.7 g/dL (ref 13.0–17.0)
MCH: 28.1 pg (ref 26.0–34.0)
MCHC: 32.7 g/dL (ref 30.0–36.0)
Monocytes Relative: 6 % (ref 3–12)
Neutrophils Relative %: 66 % (ref 43–77)
RDW: 13.1 % (ref 11.5–15.5)

## 2012-12-24 LAB — BASIC METABOLIC PANEL
BUN: 10 mg/dL (ref 6–23)
Creatinine, Ser: 0.73 mg/dL (ref 0.50–1.35)
GFR calc Af Amer: >90 mL/min (ref >90)
GFR calc non Af Amer: >90 mL/min (ref >90)
Potassium: 4.3 mEq/L (ref 3.5–5.1)

## 2012-12-24 MED ORDER — ENOXAPARIN SODIUM 150 MG/ML ~~LOC~~ SOLN
1.0000 mg/kg | Freq: Once | SUBCUTANEOUS | Status: AC
  Administered 2012-12-24: 165 mg via SUBCUTANEOUS
  Filled 2012-12-24: qty 2

## 2012-12-24 NOTE — ED Provider Notes (Signed)
CSN: [id number]     Arrival date & time 12/24/12  1402 History  This chart was scribed for Donnetta Hutching, MD by Danella Maiers, ED Scribe. This patient was seen in room APA04/[facility]04 and the patient's care was started at 5:38 PM.   Chief Complaint  Patient presents with  . Arm Pain   The history is provided by the patient. No language interpreter was used.   HPI Comments: Jesse Ellis is a 32 y.o. male who presents to the Emergency Department complaining of constant moderate right forearm pain for the past ten days with associated swelling. The pain is worsened by bowling (he is in a league). He denies any injuries or trauma. He denies h/o DVT or PE. Severity is mild to moderate. No chest pain, shortness of breath  Past Medical History  Diagnosis Date  . Chronic back pain     "chronic skeletal problems"  . PTSD (post-traumatic stress disorder)   . Anxiety   . Depression   . Hypertension    History reviewed. No pertinent past surgical history. History reviewed. No pertinent family history. History  Substance Use Topics  . Smoking status: Never Smoker   . Smokeless tobacco: Not on file  . Alcohol Use: No    Review of Systems  Musculoskeletal: Positive for myalgias (right forearm).   A complete 10 system review of systems was obtained and all systems are negative except as noted in the HPI and PMH.   Allergies  Sumac  Home Medications   Current Outpatient Rx  Name  Route  Sig  Dispense  Refill  . Cyanocobalamin 2500 MCG CHEW   Oral   Chew 1 tablet by mouth daily.         Marland Kitchen FLUoxetine (PROZAC) 20 MG/5ML solution   Oral   Take 40 mg by mouth daily.         Marland Kitchen gabapentin (NEURONTIN) 400 MG capsule   Oral   Take 800 mg by mouth 3 (three) times daily.         . haloperidol (HALDOL) 10 MG tablet   Oral   Take 10 mg by mouth daily as needed (for aggrssion or seizures).         Marland Kitchen lisinopril (PRINIVIL,ZESTRIL) 10 MG tablet   Oral   Take 10 mg by mouth daily.          . Oxycodone HCl 10 MG TABS   Oral   Take 10 mg by mouth 4 (four) times daily as needed (for pain).           BP 149/81  Pulse 101  Temp(Src) 98.8 F (37.1 C) (Oral)  Resp 20  Ht 5\' 11"  (1.803 m)  Wt 359 lb (162.841 kg)  BMI 50.09 kg/m2  SpO2 98% Physical Exam  Nursing note and vitals reviewed. Constitutional: He is oriented to person, place, and time. He appears well-developed and well-nourished.  HENT:  Head: Normocephalic and atraumatic.  Eyes: Conjunctivae and EOM are normal. Pupils are equal, round, and reactive to light.  Neck: Normal range of motion. Neck supple.  Cardiovascular: Normal rate, regular rhythm and normal heart sounds.   Pulmonary/Chest: Effort normal and breath sounds normal.  Abdominal: Soft. Bowel sounds are normal.  Musculoskeletal: Normal range of motion.  ttp to entire anterior medial forearm. Forearm left proximal: 32.5cm, mid: 31cm, distal: 23cm. Right proximal: 35cm, mid: 33cm, distal: 23.5cm.   Neurological: He is alert and oriented to person, place, and time.  Skin: Skin is  warm and dry.  Psychiatric: He has a normal mood and affect.    ED Course  Procedures (including critical care time) Medications - No data to display  DIAGNOSTIC STUDIES: Oxygen Saturation is 98% on RA, normal by my interpretation.    COORDINATION OF CARE: 5:58 PM- Discussed treatment plan with pt which includes Lovenox and Doppler study. Pt agrees to plan.    Labs Review Labs Reviewed  BASIC METABOLIC PANEL - Abnormal; Notable for the following:    Glucose, Bld 253 (*)    All other components within normal limits  CBC WITH DIFFERENTIAL   Imaging Review No results found.  EKG Interpretation   None       MDM  No diagnosis found.   Right forearm is clearly more swollen than left.   Rx Lovenox 1 mg per kilogram subcutaneous. Doppler study of left arm tomorrow morning.     I personally performed the services described in this documentation, which  was scribed in my presence. The recorded information has been reviewed and is accurate.    Donnetta Hutching, MD 12/24/12 [id number]

## 2012-12-24 NOTE — ED Notes (Signed)
Pain rt forearm,x 1 week, rectal bleeding for 3-4 days.   Bleeding with bm. And with flatus.

## 2012-12-24 NOTE — ED Notes (Signed)
Appt is at 0930 on 12-25-12 for U/S.

## 2012-12-25 ENCOUNTER — Ambulatory Visit (HOSPITAL_COMMUNITY)
Admit: 2012-12-25 | Discharge: 2012-12-25 | Disposition: A | Discharge status: Home / Self Care | Payer: Medicare Other | Attending: Emergency Medicine | Admitting: Emergency Medicine

## 2012-12-25 DIAGNOSIS — M7989 Other specified soft tissue disorders: Secondary | ICD-10-CM | POA: Insufficient documentation

## 2013-01-20 ENCOUNTER — Emergency Department (HOSPITAL_COMMUNITY)
Admission: EM | Admit: 2013-01-20 | Discharge: 2013-01-20 | Disposition: A | Discharge status: Home / Self Care | Payer: Medicare Other | Attending: Emergency Medicine | Admitting: Emergency Medicine

## 2013-01-20 ENCOUNTER — Encounter (HOSPITAL_COMMUNITY): Payer: Self-pay | Admitting: Emergency Medicine

## 2013-01-20 ENCOUNTER — Emergency Department (HOSPITAL_COMMUNITY): Payer: Medicare Other

## 2013-01-20 DIAGNOSIS — F4310 Post-traumatic stress disorder, unspecified: Secondary | ICD-10-CM | POA: Insufficient documentation

## 2013-01-20 DIAGNOSIS — Z79899 Other long term (current) drug therapy: Secondary | ICD-10-CM | POA: Insufficient documentation

## 2013-01-20 DIAGNOSIS — I10 Essential (primary) hypertension: Secondary | ICD-10-CM | POA: Insufficient documentation

## 2013-01-20 DIAGNOSIS — L02219 Cutaneous abscess of trunk, unspecified: Secondary | ICD-10-CM | POA: Insufficient documentation

## 2013-01-20 DIAGNOSIS — L02215 Cutaneous abscess of perineum: Secondary | ICD-10-CM

## 2013-01-20 DIAGNOSIS — G8929 Other chronic pain: Secondary | ICD-10-CM | POA: Insufficient documentation

## 2013-01-20 DIAGNOSIS — IMO0002 Reserved for concepts with insufficient information to code with codable children: Secondary | ICD-10-CM | POA: Insufficient documentation

## 2013-01-20 DIAGNOSIS — F3289 Other specified depressive episodes: Secondary | ICD-10-CM | POA: Insufficient documentation

## 2013-01-20 DIAGNOSIS — Y929 Unspecified place or not applicable: Secondary | ICD-10-CM | POA: Insufficient documentation

## 2013-01-20 DIAGNOSIS — F329 Major depressive disorder, single episode, unspecified: Secondary | ICD-10-CM | POA: Insufficient documentation

## 2013-01-20 DIAGNOSIS — Y9383 Activity, rough housing and horseplay: Secondary | ICD-10-CM | POA: Insufficient documentation

## 2013-01-20 DIAGNOSIS — S8990XA Unspecified injury of unspecified lower leg, initial encounter: Secondary | ICD-10-CM | POA: Insufficient documentation

## 2013-01-20 DIAGNOSIS — M79675 Pain in left toe(s): Secondary | ICD-10-CM

## 2013-01-20 DIAGNOSIS — F411 Generalized anxiety disorder: Secondary | ICD-10-CM | POA: Insufficient documentation

## 2013-01-20 MED ORDER — IBUPROFEN 800 MG PO TABS: 800.0000 mg | ORAL_TABLET | Freq: Three times a day (TID) | ORAL | Status: DC

## 2013-01-20 MED ORDER — SULFAMETHOXAZOLE-TMP DS 800-160 MG PO TABS: 1.0000 | ORAL_TABLET | Freq: Two times a day (BID) | ORAL | Status: DC

## 2013-01-20 MED ORDER — LIDOCAINE HCL (PF) 1 % IJ SOLN
INTRAMUSCULAR | Status: AC
  Administered 2013-01-20: 22:00:00
  Filled 2013-01-20: qty 5

## 2013-01-20 NOTE — ED Notes (Signed)
EDPa in room for initial assessment.

## 2013-01-20 NOTE — ED Provider Notes (Signed)
CSN: [id number]     Arrival date & time 01/20/13  2137 History   First MD Initiated Contact with Patient 01/20/13 2148     Chief Complaint  Patient presents with  . Toe Injury  . Abscess   (Consider location/radiation/quality/duration/timing/severity/associated sxs/prior Treatment) HPI Comments: Patient here also with complaints of left great toe injury - states that about 3 weeks ago he was wrestling with his nephew when he hit his toe - reports pain since then, has been ambulatory, no deformity, no bruising.  Also here with perineal abscess - states x 2 days - has tried squeezing the area and warm compresses - denies drainage to the area.  Patient is a 32 y.o. male presenting with abscess. The history is provided by the patient. No language interpreter was used.  Abscess Location:  Ano-genital Ano-genital abscess location:  Perineum Abscess quality: fluctuance, induration and painful   Abscess quality: not draining, no itching and not weeping   Red streaking: no   Duration:  2 days Progression:  Worsening Pain details:    Quality:  Dull   Severity:  Mild   Timing:  Constant   Progression:  Worsening Chronicity:  New Context: not diabetes, not immunosuppression and not injected drug use   Relieved by:  Nothing Worsened by:  Draining/squeezing and warm compresses Ineffective treatments:  Draining/squeezing and warm compresses   Past Medical History  Diagnosis Date  . Chronic back pain     "chronic skeletal problems"  . PTSD (post-traumatic stress disorder)   . Anxiety   . Depression   . Hypertension    History reviewed. No pertinent past surgical history. No family history on file. History  Substance Use Topics  . Smoking status: Never Smoker   . Smokeless tobacco: Not on file  . Alcohol Use: No    Review of Systems  All other systems reviewed and are negative.    Allergies  Sumac  Home Medications   Current Outpatient Rx  Name  Route  Sig  Dispense   Refill  . Cyanocobalamin 2500 MCG CHEW   Oral   Chew 1 tablet by mouth daily.         Marland Kitchen FLUoxetine (PROZAC) 20 MG/5ML solution   Oral   Take 40 mg by mouth daily.         Marland Kitchen gabapentin (NEURONTIN) 400 MG capsule   Oral   Take 800 mg by mouth 3 (three) times daily.         . haloperidol (HALDOL) 10 MG tablet   Oral   Take 10 mg by mouth daily as needed (for aggrssion or seizures).         Marland Kitchen lisinopril (PRINIVIL,ZESTRIL) 10 MG tablet   Oral   Take 10 mg by mouth daily.         . Oxycodone HCl 10 MG TABS   Oral   Take 10 mg by mouth 4 (four) times daily as needed (for pain).           BP 139/79  Pulse 101  Temp(Src) 97.7 F (36.5 C) (Oral)  Ht 5\' 11"  (1.803 m)  Wt 350 lb (158.759 kg)  BMI 48.84 kg/m2  SpO2 98% Physical Exam  Nursing note and vitals reviewed. Constitutional: He is oriented to person, place, and time. He appears well-developed and well-nourished. No distress.  HENT:  Head: Normocephalic and atraumatic.  Eyes: Conjunctivae are normal. No scleral icterus.  Pulmonary/Chest: Effort normal.  Musculoskeletal:  Left foot: He exhibits tenderness and bony tenderness. He exhibits normal range of motion, no swelling, normal capillary refill and no deformity.       Feet:  Neurological: He is alert and oriented to person, place, and time. He exhibits normal muscle tone. Coordination normal.  Skin: Skin is warm. There is erythema.  1cm area of induration with fluctuance to very center at the perineum at the left gluteal fold.  No drainage noted.  Psychiatric: He has a normal mood and affect. His behavior is normal. Judgment and thought content normal.    ED Course  Procedures (including critical care time) Labs Review Labs Reviewed - No data to display Imaging Review No results found.  EKG Interpretation   None     INCISION AND DRAINAGE Performed by: Cherrie Distance C. Consent: Verbal consent obtained. Risks and benefits: risks, benefits  and alternatives were discussed Type: abscess  Body area: perineum  Anesthesia: local infiltration  Incision was made with a scalpel.  Local anesthetic: lidocaine 1 % without epinephrine  Anesthetic total: 1 ml  Complexity: complex Blunt dissection to break up loculations  Drainage: purulent  Drainage amount: small  Packing material: none  Patient tolerance: Patient tolerated the procedure well with no immediate complications.  Results for orders placed during the hospital encounter of 12/24/12  CBC WITH DIFFERENTIAL      Result Value Range   WBC 6.9  4.0 - 10.5 K/uL   RBC 4.88  4.22 - 5.81 MIL/uL   Hemoglobin 13.7  13.0 - 17.0 g/dL   HCT 56.2  13.0 - 86.5 %   MCV 85.9  78.0 - 100.0 fL   MCH 28.1  26.0 - 34.0 pg   MCHC 32.7  30.0 - 36.0 g/dL   RDW 78.4  69.6 - 29.5 %   Platelets 207  150 - 400 K/uL   Neutrophils Relative % 66  43 - 77 %   Neutro Abs 4.5  1.7 - 7.7 K/uL   Lymphocytes Relative 25  12 - 46 %   Lymphs Abs 1.7  0.7 - 4.0 K/uL   Monocytes Relative 6  3 - 12 %   Monocytes Absolute 0.4  0.1 - 1.0 K/uL   Eosinophils Relative 3  0 - 5 %   Eosinophils Absolute 0.2  0.0 - 0.7 K/uL   Basophils Relative 0  0 - 1 %   Basophils Absolute 0.0  0.0 - 0.1 K/uL  BASIC METABOLIC PANEL      Result Value Range   Sodium 136  135 - 145 mEq/L   Potassium 4.3  3.5 - 5.1 mEq/L   Chloride 99  96 - 112 mEq/L   CO2 29  19 - 32 mEq/L   Glucose, Bld 253 (*) 70 - 99 mg/dL   BUN 10  6 - 23 mg/dL   Creatinine, Ser 2.84  0.50 - 1.35 mg/dL   Calcium 9.6  8.4 - 13.2 mg/dL   GFR calc non Af Amer >90  >90 mL/min   GFR calc Af Amer >90  >90 mL/min   US Venous Img Upper Uni Right  12/25/2012   CLINICAL DATA:  Right forearm swelling, injury  EXAM: RIGHT UPPER EXTREMITY VENOUS ULTRASOUND  TECHNIQUE: Gray-scale sonography with graded compression, as well as color Doppler and duplex ultrasound were performed to evaluate the upper extremity deep venous system from the level of the  subclavian vein and including the jugular, axillary, basilic and upper cephalic vein. Spectral Doppler was utilized to  evaluate flow at rest and with distal augmentation maneuvers.  COMPARISON:  None.  FINDINGS: Thrombus within deep veins:  None visualized.  Compressibility of deep veins:  Normal.  Duplex waveform respiratory phasicity:  Normal.  Duplex waveform response to augmentation:  Normal.  Venous reflux:  None visualized.  Other findings:  None visualized.  IMPRESSION: Negative for right upper extremity DVT.   Electronically Signed   By: Ruel Favors M.D.   On: 12/25/2012 10:38   Dg Toe Great Left  01/20/2013   CLINICAL DATA:  Toe injury, abscess.  Injury 3 weeks ago.  EXAM: LEFT GREAT TOE  COMPARISON:  03/29/2006  FINDINGS: There is soft tissue swelling involving the great toe. No evidence for acute fracture or subluxation. No radiopaque foreign body or soft tissue gas identified.  IMPRESSION: 1. Soft tissue swelling. 2.  No evidence for acute osseous abnormality.   Electronically Signed   By: Rosalie Gums M.D.   On: 01/20/2013 22:23      MDM  Left great toe contusion Perineal abscess  Patient here with pain to left great toe - no fracture - has been ambulatory for almost a month, will continue current.  Abscess drained, left open, will do twice daily soaks and warm compresses - abx.   Izola Price Marisue Humble, PA-C 01/20/13 2234

## 2013-01-20 NOTE — ED Notes (Signed)
Patient c/o right big toe injury x 3 weeks ago.  Patient also states he has a boil on his left upper thigh.

## 2013-01-21 NOTE — ED Provider Notes (Signed)
Medical screening examination/treatment/procedure(s) were performed by non-physician practitioner and as supervising physician I was immediately available for consultation/collaboration.  EKG Interpretation   None       Stephine Langbehn, MD, FACEP   Eland Lamantia L Kayleen Alig, MD 01/21/13 0012 

## 2013-10-14 ENCOUNTER — Emergency Department (HOSPITAL_COMMUNITY)
Admission: EM | Admit: 2013-10-14 | Discharge: 2013-10-14 | Disposition: A | Discharge status: Home / Self Care | Payer: Medicare Other | Attending: Emergency Medicine | Admitting: Emergency Medicine

## 2013-10-14 ENCOUNTER — Encounter (HOSPITAL_COMMUNITY): Payer: Self-pay | Admitting: Emergency Medicine

## 2013-10-14 DIAGNOSIS — F411 Generalized anxiety disorder: Secondary | ICD-10-CM | POA: Diagnosis not present

## 2013-10-14 DIAGNOSIS — I10 Essential (primary) hypertension: Secondary | ICD-10-CM | POA: Insufficient documentation

## 2013-10-14 DIAGNOSIS — G8929 Other chronic pain: Secondary | ICD-10-CM | POA: Insufficient documentation

## 2013-10-14 DIAGNOSIS — L02416 Cutaneous abscess of left lower limb: Secondary | ICD-10-CM

## 2013-10-14 DIAGNOSIS — F329 Major depressive disorder, single episode, unspecified: Secondary | ICD-10-CM | POA: Diagnosis not present

## 2013-10-14 DIAGNOSIS — Z79899 Other long term (current) drug therapy: Secondary | ICD-10-CM | POA: Diagnosis not present

## 2013-10-14 DIAGNOSIS — M25559 Pain in unspecified hip: Secondary | ICD-10-CM | POA: Insufficient documentation

## 2013-10-14 DIAGNOSIS — Z792 Long term (current) use of antibiotics: Secondary | ICD-10-CM | POA: Diagnosis not present

## 2013-10-14 DIAGNOSIS — F3289 Other specified depressive episodes: Secondary | ICD-10-CM | POA: Diagnosis not present

## 2013-10-14 DIAGNOSIS — L03119 Cellulitis of unspecified part of limb: Secondary | ICD-10-CM | POA: Diagnosis not present

## 2013-10-14 DIAGNOSIS — L02419 Cutaneous abscess of limb, unspecified: Secondary | ICD-10-CM | POA: Diagnosis not present

## 2013-10-14 MED ORDER — SULFAMETHOXAZOLE-TRIMETHOPRIM 800-160 MG PO TABS: 1.0000 | ORAL_TABLET | Freq: Two times a day (BID) | ORAL | Status: DC

## 2013-10-14 MED ORDER — DOXYCYCLINE HYCLATE 100 MG PO TABS
100.0000 mg | ORAL_TABLET | Freq: Once | ORAL | Status: AC
Start: 2013-10-14 — End: 2013-10-14
  Administered 2013-10-14: 100 mg via ORAL
  Filled 2013-10-14: qty 1

## 2013-10-14 NOTE — ED Notes (Signed)
Patient presents w/c/o painful nodule on posterior L thigh at gluteal fold.  Has tried squeezing it, but has not gotten anything out of it.  Has used his rx Percocet for pain which has helped w/pain.  Denies fevers.

## 2013-10-14 NOTE — Discharge Instructions (Signed)
Abscess °An abscess (boil or furuncle) is an infected area on or under the skin. This area is filled with yellowish-white fluid (pus) and other material (debris). °HOME CARE  °· Only take medicines as told by your doctor. °· If you were given antibiotic medicine, take it as directed. Finish the medicine even if you start to feel better. °· If gauze is used, follow your doctor's directions for changing the gauze. °· To avoid spreading the infection: °¨ Keep your abscess covered with a bandage. °¨ Wash your hands well. °¨ Do not share personal care items, towels, or whirlpools with others. °¨ Avoid skin contact with others. °· Keep your skin and clothes clean around the abscess. °· Keep all doctor visits as told. °GET HELP RIGHT AWAY IF:  °· You have more pain, puffiness (swelling), or redness in the wound site. °· You have more fluid or blood coming from the wound site. °· You have muscle aches, chills, or you feel sick. °· You have a fever. °MAKE SURE YOU:  °· Understand these instructions. °· Will watch your condition. °· Will get help right away if you are not doing well or get worse. °Document Released: 07/16/2007 Document Revised: 07/29/2011 Document Reviewed: 04/11/2011 °Exit[employer]® Patient Information ©2015 [employer]. This information is not intended to replace advice given to you by your health care provider. Make sure you discuss any questions you have with your health care provider. ° °

## 2013-10-16 NOTE — ED Provider Notes (Signed)
CSN: [id number]     Arrival date & time 10/14/13  1111 History   First MD Initiated Contact with Patient 10/14/13 1126     Chief Complaint  Patient presents with  . Abscess     (Consider location/radiation/quality/duration/timing/severity/associated sxs/prior Treatment) HPI  Jesse Ellis is a 33 y.o. male who presents to the Emergency Department complaining of painful, red nodule to the back of his left thigh.  He states he noticed it several days ago.  He states that he is tried to squeeze it but nothing comes out of it. He reports history of the same, but states they usually go away. He states that he has taken Percocet which has helped with his pain. He denies any fevers, chills, difficulty walking or standing, or other similar looking lesions or rashes  Past Medical History  Diagnosis Date  . Chronic back pain     "chronic skeletal problems"  . PTSD (post-traumatic stress disorder)   . Anxiety   . Depression   . Hypertension    History reviewed. No pertinent past surgical history. History reviewed. No pertinent family history. History  Substance Use Topics  . Smoking status: Never Smoker   . Smokeless tobacco: Not on file  . Alcohol Use: No    Review of Systems  Constitutional: Negative for fever and chills.  Gastrointestinal: Negative for nausea and vomiting.  Musculoskeletal: Negative for arthralgias and joint swelling.  Skin: Positive for color change.       "bump" to the back of left thigh  Hematological: Negative for adenopathy.  All other systems reviewed and are negative.     Allergies  Sumac  Home Medications   Prior to Admission medications   Medication Sig Start Date End Date Taking? Authorizing Provider  Cyanocobalamin 2500 MCG CHEW Chew 1 tablet by mouth daily.   Yes Historical Provider, MD  divalproex (DEPAKOTE ER) 500 MG 24 hr tablet Take 1 tablet by mouth at bedtime. 09/07/13  Yes Historical Provider, MD  gabapentin (NEURONTIN) 400 MG capsule Take  800 mg by mouth 3 (three) times daily.   Yes Historical Provider, MD  haloperidol (HALDOL) 10 MG tablet Take 10 mg by mouth daily as needed (for aggrssion or seizures).   Yes Historical Provider, MD  metoprolol tartrate (LOPRESSOR) 25 MG tablet Take 25 mg by mouth 2 (two) times daily.   Yes Historical Provider, MD  Oxycodone HCl 10 MG TABS Take 10 mg by mouth 4 (four) times daily as needed (for pain).    Yes Historical Provider, MD  traZODone (DESYREL) 50 MG tablet Take 1 tablet by mouth at bedtime. 09/07/13  Yes Historical Provider, MD  sulfamethoxazole-trimethoprim (SEPTRA DS) 800-160 MG per tablet Take 1 tablet by mouth 2 (two) times daily. For 10 days 10/14/13   Clovis Mankins L. Koa Palla, PA-C   BP 128/80  Pulse 61  Temp(Src) 98.2 F (36.8 C) (Oral)  Resp 16  Ht  (1.778 m)  Wt 329 lb (149.233 kg)  BMI 47.21 kg/m2  SpO2 99% Physical Exam  Nursing note and vitals reviewed. Constitutional: He is oriented to person, place, and time. He appears well-developed and well-nourished. No distress.  HENT:  Head: Normocephalic and atraumatic.  Cardiovascular: Normal rate, regular rhythm, normal heart sounds and intact distal pulses.   No murmur heard. Pulmonary/Chest: Effort normal and breath sounds normal. No respiratory distress.  Neurological: He is alert and oriented to person, place, and time. He exhibits normal muscle tone. Coordination normal.  Skin: Skin is  warm and dry. There is erythema.  Mildly erythematous, 2 cm single nodule to the posterior left thigh.  Mild induration, no fluctuance, surrounding erythema or drainage.      ED Course  Procedures (including critical care time) Labs Review Labs Reviewed - No data to display  Imaging Review No results found.   EKG Interpretation None      MDM   Final diagnoses:  Abscess of left thigh   Patient is well appearing. Vital signs are stable. Single nodule to the posterior left thigh possibly an early abscess. No clinical symptoms  of cellulitis. Patient reports history of same. I&D not indicated at this time. He agrees to frequent warm compresses or soaks, oral antibiotics and close followup with his primary care physician or to return here in 2-3 days if symptoms are not improving.  He appears stable for d/c and agrees to plan.      Yavuz Kirby L. Trisha Mangle, PA-C 10/16/13 1653

## 2013-10-17 NOTE — ED Provider Notes (Signed)
Medical screening examination/treatment/procedure(s) were performed by non-physician practitioner and as supervising physician I was immediately available for consultation/collaboration.   EKG Interpretation None        Dakai Braithwaite L Zuleica Seith, MD 10/17/13 1347 

## 2013-10-21 ENCOUNTER — Encounter (HOSPITAL_COMMUNITY): Payer: Self-pay | Admitting: Emergency Medicine

## 2013-10-21 ENCOUNTER — Emergency Department (HOSPITAL_COMMUNITY)
Admission: EM | Admit: 2013-10-21 | Discharge: 2013-10-21 | Disposition: A | Discharge status: Home / Self Care | Payer: Medicare Other | Attending: Emergency Medicine | Admitting: Emergency Medicine

## 2013-10-21 DIAGNOSIS — M26629 Arthralgia of temporomandibular joint, unspecified side: Secondary | ICD-10-CM

## 2013-10-21 DIAGNOSIS — I10 Essential (primary) hypertension: Secondary | ICD-10-CM | POA: Insufficient documentation

## 2013-10-21 DIAGNOSIS — F329 Major depressive disorder, single episode, unspecified: Secondary | ICD-10-CM | POA: Insufficient documentation

## 2013-10-21 DIAGNOSIS — F3289 Other specified depressive episodes: Secondary | ICD-10-CM | POA: Insufficient documentation

## 2013-10-21 DIAGNOSIS — G8929 Other chronic pain: Secondary | ICD-10-CM | POA: Diagnosis not present

## 2013-10-21 DIAGNOSIS — H6691 Otitis media, unspecified, right ear: Secondary | ICD-10-CM

## 2013-10-21 DIAGNOSIS — F411 Generalized anxiety disorder: Secondary | ICD-10-CM | POA: Insufficient documentation

## 2013-10-21 DIAGNOSIS — Z792 Long term (current) use of antibiotics: Secondary | ICD-10-CM | POA: Diagnosis not present

## 2013-10-21 DIAGNOSIS — Z79899 Other long term (current) drug therapy: Secondary | ICD-10-CM | POA: Diagnosis not present

## 2013-10-21 DIAGNOSIS — H9209 Otalgia, unspecified ear: Secondary | ICD-10-CM | POA: Insufficient documentation

## 2013-10-21 DIAGNOSIS — H6690 Otitis media, unspecified, unspecified ear: Secondary | ICD-10-CM | POA: Diagnosis not present

## 2013-10-21 MED ORDER — IBUPROFEN 800 MG PO TABS
800.0000 mg | ORAL_TABLET | Freq: Once | ORAL | Status: AC
  Administered 2013-10-21: 800 mg via ORAL
  Filled 2013-10-21: qty 1

## 2013-10-21 MED ORDER — AMOXICILLIN 250 MG PO CAPS
500.0000 mg | ORAL_CAPSULE | Freq: Once | ORAL | Status: AC
  Administered 2013-10-21: 500 mg via ORAL
  Filled 2013-10-21: qty 2

## 2013-10-21 MED ORDER — IBUPROFEN 800 MG PO TABS: 800.0000 mg | ORAL_TABLET | Freq: Three times a day (TID) | ORAL | Status: DC

## 2013-10-21 MED ORDER — AMOXICILLIN 500 MG PO CAPS: 500.0000 mg | ORAL_CAPSULE | Freq: Three times a day (TID) | ORAL | Status: DC

## 2013-10-21 NOTE — Discharge Instructions (Signed)
Otitis Media Otitis media is redness, soreness, and inflammation of the middle ear. Otitis media may be caused by allergies or, most commonly, by infection. Often it occurs as a complication of the common cold. SIGNS AND SYMPTOMS Symptoms of otitis media may include:  Earache.  Fever.  Ringing in your ear.  Headache.  Leakage of fluid from the ear. DIAGNOSIS To diagnose otitis media, your health care provider will examine your ear with an otoscope. This is an instrument that allows your health care provider to see into your ear in order to examine your eardrum. Your health care provider also will ask you questions about your symptoms. TREATMENT  Typically, otitis media resolves on its own within 3-5 days. Your health care provider may prescribe medicine to ease your symptoms of pain. If otitis media does not resolve within 5 days or is recurrent, your health care provider may prescribe antibiotic medicines if he or she suspects that a bacterial infection is the cause. HOME CARE INSTRUCTIONS   If you were prescribed an antibiotic medicine, finish it all even if you start to feel better.  Take medicines only as directed by your health care provider.  Keep all follow-up visits as directed by your health care provider. SEEK MEDICAL CARE IF:  You have otitis media only in one ear, or bleeding from your nose, or both.  You notice a lump on your neck.  You are not getting better in 3-5 days.  You feel worse instead of better. SEEK IMMEDIATE MEDICAL CARE IF:   You have pain that is not controlled with medicine.  You have swelling, redness, or pain around your ear or stiffness in your neck.  You notice that part of your face is paralyzed.  You notice that the bone behind your ear (mastoid) is tender when you touch it. MAKE SURE YOU:   Understand these instructions.  Will watch your condition.  Will get help right away if you are not doing well or get worse. Document Released:  11/02/2003 Document Revised: 06/13/2013 Document Reviewed: 08/24/2012 [facility] Patient Information 2015 [place], [place]. This information is not intended to replace advice given to you by your health care provider. Make sure you discuss any questions you have with your health care provider.  Temporomandibular Problems  Temporomandibular joint (TMJ) dysfunction means there are problems with the joint between your jaw and your skull. This is a joint lined by cartilage like other joints in your body but also has a small disc in the joint which keeps the bones from rubbing on each other. These joints are like other joints and can get inflamed (sore) from arthritis and other problems. When this joint gets sore, it can cause headaches and pain in the jaw and the face. CAUSES  Usually the arthritic types of problems are caused by soreness in the joint. Soreness in the joint can also be caused by overuse. This may come from grinding your teeth. It may also come from mis-alignment in the joint. DIAGNOSIS Diagnosis of this condition can often be made by history and exam. Sometimes your caregiver may need X-rays or an MRI scan to determine the exact cause. It may be necessary to see your dentist to determine if your teeth and jaws are lined up correctly. TREATMENT  Most of the time this problem is not serious; however, sometimes it can persist (become chronic). When this happens medications that will cut down on inflammation (soreness) help. Sometimes a shot of cortisone into the joint will be  helpful. If your teeth are not aligned it may help for your dentist to make a splint for your mouth that can help this problem. If no physical problems can be found, the problem may come from tension. If tension is found to be the cause, biofeedback or relaxation techniques may be helpful. HOME CARE INSTRUCTIONS   Later in the day, applications of ice packs may be helpful. Ice can be used in a plastic bag with a towel around  it to prevent frostbite to skin. This may be used about every 2 hours for 20 to 30 minutes, as needed while awake, or as directed by your caregiver.  Only take over-the-counter or prescription medicines for pain, discomfort, or fever as directed by your caregiver.  If physical therapy was prescribed, follow your caregiver's directions.  Wear mouth appliances as directed if they were given. Document Released: 10/22/2000 Document Revised: 04/21/2011 Document Reviewed: 01/30/2008 [facility] Patient Information 2015 [place], [place]. This information is not intended to replace advice given to you by your health care provider. Make sure you discuss any questions you have with your health care provider.

## 2013-10-21 NOTE — ED Notes (Signed)
Pt co rt ear pain x 2 days, denies fevers.

## 2013-10-23 NOTE — ED Provider Notes (Signed)
CSN: [id number]     Arrival date & time 10/21/13  1337 History   First MD Initiated Contact with Patient 10/21/13 1400     Chief Complaint  Patient presents with  . Otalgia    rt ear     (Consider location/radiation/quality/duration/timing/severity/associated sxs/prior Treatment) The history is provided by the patient.   Jesse Ellis is a 33 y.o. male presenting with a 2 day history of right earache and jaw pain.  He describes having a popping sensation during which time his right jaw briefly "locked" while eating a meal.  He has since been able to eat without similar popping or locking but has persistent soreness in the right jaw and pain in the right ear.  He has noticed decreased hearing acuity as well.  He has had no fevers, chills, no ear drainage, denies tinnitus, dizziness, headache.  Additionally he denies dental pain or gingival swelling.  He has taken oxycodone which relieves his pain temporarily.      Past Medical History  Diagnosis Date  . Chronic back pain     "chronic skeletal problems"  . PTSD (post-traumatic stress disorder)   . Anxiety   . Depression   . Hypertension    History reviewed. No pertinent past surgical history. History reviewed. No pertinent family history. History  Substance Use Topics  . Smoking status: Never Smoker   . Smokeless tobacco: Not on file  . Alcohol Use: No    Review of Systems  Constitutional: Negative for fever and chills.  HENT: Positive for ear pain and hearing loss. Negative for congestion, dental problem, ear discharge, rhinorrhea, sinus pressure, sore throat, tinnitus, trouble swallowing and voice change.   Eyes: Negative for discharge.  Respiratory: Negative for cough, shortness of breath, wheezing and stridor.   Cardiovascular: Negative for chest pain.  Gastrointestinal: Negative for abdominal pain.  Genitourinary: Negative.       Allergies  Sumac  Home Medications   Prior to Admission medications   Medication  Sig Start Date End Date Taking? Authorizing Provider  Cyanocobalamin 2500 MCG CHEW Chew 1 tablet by mouth daily.   Yes Historical Provider, MD  divalproex (DEPAKOTE ER) 500 MG 24 hr tablet Take 1 tablet by mouth at bedtime. 09/07/13  Yes Historical Provider, MD  gabapentin (NEURONTIN) 400 MG capsule Take 800 mg by mouth 3 (three) times daily.   Yes Historical Provider, MD  metoprolol tartrate (LOPRESSOR) 25 MG tablet Take 25 mg by mouth 2 (two) times daily.   Yes Historical Provider, MD  Oxycodone HCl 10 MG TABS Take 10 mg by mouth 4 (four) times daily as needed (for pain).    Yes Historical Provider, MD  sulfamethoxazole-trimethoprim (SEPTRA DS) 800-160 MG per tablet Take 1 tablet by mouth 2 (two) times daily. For 10 days 10/14/13  Yes Tammy L. Triplett, PA-C  traZODone (DESYREL) 50 MG tablet Take 1 tablet by mouth at bedtime. 09/07/13  Yes Historical Provider, MD  amoxicillin (AMOXIL) 500 MG capsule Take 1 capsule (500 mg total) by mouth 3 (three) times daily. 10/21/13   Burgess Amor, PA-C  haloperidol (HALDOL) 10 MG tablet Take 10 mg by mouth daily as needed (for aggrssion or seizures).    Historical Provider, MD  ibuprofen (ADVIL,MOTRIN) 800 MG tablet Take 1 tablet (800 mg total) by mouth 3 (three) times daily. 10/21/13   Burgess Amor, PA-C   BP 129/72  Pulse 69  Temp(Src) 98.9 F (37.2 C) (Oral)  Resp 20  Ht  (1.803 m)  Wt 329 lb (149.233 kg)  BMI 45.91 kg/m2  SpO2 98% Physical Exam  Constitutional: He is oriented to person, place, and time. He appears well-developed and well-nourished.  HENT:  Head: Normocephalic and atraumatic.  Right Ear: Ear canal normal. No drainage or swelling. No mastoid tenderness. Tympanic membrane is injected, erythematous and bulging. No middle ear effusion. No decreased hearing is noted.  Left Ear: Tympanic membrane and ear canal normal.  Nose: No mucosal edema or rhinorrhea.  Mouth/Throat: Uvula is midline, oropharynx is clear and moist and mucous membranes  are normal. No oropharyngeal exudate, posterior oropharyngeal edema, posterior oropharyngeal erythema or tonsillar abscesses.  Cerumen plug right canal easily removed with lighted curette.  Pt tolerated well.  ttp along right TMJ joint,  No crepitus with ROM, with FROM of jaw, teeth are aligned appropriately, no palpable deformity or edema. Dentition healthy, no gingival edema.  Eyes: Conjunctivae are normal.  Cardiovascular: Normal rate and normal heart sounds.   Pulmonary/Chest: Effort normal. No respiratory distress. He has no wheezes. He has no rales.  Abdominal: Soft. There is no tenderness.  Musculoskeletal: Normal range of motion.  Neurological: He is alert and oriented to person, place, and time.  Skin: Skin is warm and dry. No rash noted.  Psychiatric: He has a normal mood and affect.    ED Course  Procedures (including critical care time) Labs Review Labs Reviewed - No data to display  Imaging Review No results found.   EKG Interpretation None      MDM   Final diagnoses:  Acute right otitis media, recurrence not specified, unspecified otitis media type  TMJ arthralgia    Right otitis media - patient prescribed amoxil, ibuprofen.  Advised soft food diet until jaw pain improved. Suspect transient subluxation of jaw, no evidence at this point of subluxation, bony trauma.   Prn f/u.  Referral to ent for further eval if sx worsen or persist.  The patient appears reasonably screened and/or stabilized for discharge and I doubt any other medical condition or other [facility] requiring further screening, evaluation, or treatment in the ED at this time prior to discharge.     Burgess Amor, PA-C 10/23/13 1711

## 2013-10-25 NOTE — ED Provider Notes (Signed)
Medical screening examination/treatment/procedure(s) were performed by non-physician practitioner and as supervising physician I was immediately available for consultation/collaboration.   EKG Interpretation None        Rheda Kassab, MD 10/25/13 1649 

## 2013-11-09 ENCOUNTER — Other Ambulatory Visit: Payer: Self-pay | Admitting: Neurology

## 2013-11-09 DIAGNOSIS — M545 Low back pain, unspecified: Secondary | ICD-10-CM

## 2013-11-19 ENCOUNTER — Emergency Department (HOSPITAL_COMMUNITY)
Admission: EM | Admit: 2013-11-19 | Discharge: 2013-11-19 | Disposition: A | Discharge status: Home / Self Care | Payer: Medicare Other | Attending: Emergency Medicine | Admitting: Emergency Medicine

## 2013-11-19 ENCOUNTER — Encounter (HOSPITAL_COMMUNITY): Payer: Self-pay | Admitting: Emergency Medicine

## 2013-11-19 ENCOUNTER — Emergency Department (HOSPITAL_COMMUNITY): Payer: Medicare Other

## 2013-11-19 DIAGNOSIS — S63619A Unspecified sprain of unspecified finger, initial encounter: Secondary | ICD-10-CM

## 2013-11-19 DIAGNOSIS — F329 Major depressive disorder, single episode, unspecified: Secondary | ICD-10-CM | POA: Insufficient documentation

## 2013-11-19 DIAGNOSIS — S3991XA Unspecified injury of abdomen, initial encounter: Secondary | ICD-10-CM | POA: Diagnosis present

## 2013-11-19 DIAGNOSIS — M79644 Pain in right finger(s): Secondary | ICD-10-CM | POA: Diagnosis not present

## 2013-11-19 DIAGNOSIS — Y9389 Activity, other specified: Secondary | ICD-10-CM | POA: Insufficient documentation

## 2013-11-19 DIAGNOSIS — S63614A Unspecified sprain of right ring finger, initial encounter: Secondary | ICD-10-CM | POA: Diagnosis not present

## 2013-11-19 DIAGNOSIS — G8929 Other chronic pain: Secondary | ICD-10-CM | POA: Insufficient documentation

## 2013-11-19 DIAGNOSIS — Y9289 Other specified places as the place of occurrence of the external cause: Secondary | ICD-10-CM | POA: Insufficient documentation

## 2013-11-19 DIAGNOSIS — S73102A Unspecified sprain of left hip, initial encounter: Secondary | ICD-10-CM | POA: Diagnosis not present

## 2013-11-19 DIAGNOSIS — F4310 Post-traumatic stress disorder, unspecified: Secondary | ICD-10-CM | POA: Insufficient documentation

## 2013-11-19 DIAGNOSIS — M25441 Effusion, right hand: Secondary | ICD-10-CM | POA: Insufficient documentation

## 2013-11-19 DIAGNOSIS — Z79899 Other long term (current) drug therapy: Secondary | ICD-10-CM | POA: Diagnosis not present

## 2013-11-19 DIAGNOSIS — I10 Essential (primary) hypertension: Secondary | ICD-10-CM | POA: Insufficient documentation

## 2013-11-19 DIAGNOSIS — F419 Anxiety disorder, unspecified: Secondary | ICD-10-CM | POA: Diagnosis not present

## 2013-11-19 DIAGNOSIS — X58XXXA Exposure to other specified factors, initial encounter: Secondary | ICD-10-CM | POA: Insufficient documentation

## 2013-11-19 MED ORDER — NAPROXEN 250 MG PO TABS
500.0000 mg | ORAL_TABLET | Freq: Once | ORAL | Status: AC
  Administered 2013-11-19: 500 mg via ORAL
  Filled 2013-11-19: qty 2

## 2013-11-19 MED ORDER — NAPROXEN 500 MG PO TABS: 500.0000 mg | ORAL_TABLET | Freq: Two times a day (BID) | ORAL | Status: AC

## 2013-11-19 NOTE — ED Provider Notes (Signed)
CSN: [id number]     Arrival date & time 11/19/13  1452 History   First MD Initiated Contact with Patient 11/19/13 1520     Chief Complaint  Patient presents with  . Flank Pain     (Consider location/radiation/quality/duration/timing/severity/associated sxs/prior Treatment) The history is provided by the patient.   Jesse Ellis is a 33 y.o. male with a history of chronic back pain and hypertension presenting with pain along his left hip and pelvis and right 4th finger pain after bowling last night.  He describes 2 separate injuries,  First getting his finger stuck in the balls hole during the swing, causing the distal phalanx to twist, with persistent nonradiating pain and swelling since.  Additionally he has decreased sensation to fine touch in this finger tip. Secondly, during throwing the ball, he twisted his torso causing severe sudden pain along his left lateral hip and thigh which is constant and not relieved by his daily oxycodone 10 mg which he takes bid for chronic back pain.  He has found no alleviators for his pain except for rest.  Movement makes the pain worse. He denies weakness or numbness in his legs, no urinary or bowl incontinence or retention.     Past Medical History  Diagnosis Date  . Chronic back pain     "chronic skeletal problems"  . PTSD (post-traumatic stress disorder)   . Anxiety   . Depression   . Hypertension    History reviewed. No pertinent past surgical history. No family history on file. History  Substance Use Topics  . Smoking status: Never Smoker   . Smokeless tobacco: Not on file  . Alcohol Use: No    Review of Systems  Constitutional: Negative for fever.  Gastrointestinal: Negative for nausea.  Genitourinary: Negative for dysuria and enuresis.  Musculoskeletal: Positive for arthralgias and joint swelling. Negative for myalgias.  Neurological: Positive for numbness. Negative for weakness.      Allergies  Sumac  Home Medications    Prior to Admission medications   Medication Sig Start Date End Date Taking? Authorizing Provider  Cyanocobalamin 2500 MCG CHEW Chew 1 tablet by mouth daily.   Yes Historical Provider, MD  divalproex (DEPAKOTE ER) 500 MG 24 hr tablet Take 1 tablet by mouth at bedtime. 09/07/13  Yes Historical Provider, MD  gabapentin (NEURONTIN) 400 MG capsule Take 800 mg by mouth 2 (two) times daily.    Yes Historical Provider, MD  haloperidol (HALDOL) 10 MG tablet Take 10 mg by mouth daily as needed (for aggrssion or seizures).   Yes Historical Provider, MD  metoprolol tartrate (LOPRESSOR) 25 MG tablet Take 25 mg by mouth 2 (two) times daily.   Yes Historical Provider, MD  Oxycodone HCl 10 MG TABS Take 10 mg by mouth 2 (two) times daily.    Yes Historical Provider, MD  topiramate (TOPAMAX) 25 MG tablet Take 25 mg by mouth daily. 11/08/13  Yes Historical Provider, MD  traZODone (DESYREL) 50 MG tablet Take 150 tablets by mouth at bedtime.  09/07/13  Yes Historical Provider, MD  naproxen (NAPROSYN) 500 MG tablet Take 1 tablet (500 mg total) by mouth 2 (two) times daily. 11/19/13   Burgess AmorJulie Mieshia Pepitone, PA-C   BP 127/69  Pulse 80  Temp(Src) 98.7 F (37.1 C) (Oral)  Resp 18  Ht 5\' 11"  (1.803 m)  Wt 320 lb (145.151 kg)  BMI 44.65 kg/m2  SpO2 97% Physical Exam  Constitutional: He appears well-developed and well-nourished.  HENT:  Head: Atraumatic.  Neck: Normal range of motion.  Cardiovascular:  Pulses equal bilaterally  Musculoskeletal: He exhibits tenderness.       Left hip: He exhibits bony tenderness. He exhibits normal strength, no swelling, no crepitus and no deformity.       Legs: Left lateral hip pain from pelvic rim to his greater trochanter.  No crepitus, no obvious edema, no bruising.  Limited exam given body habitus.  Right ring finger distal and middle phalanx ttp with mild edema.  No ligament instability, less than 3 sec distal cap refill.    Neurological: He is alert. He has normal strength. He  displays normal reflexes. No sensory deficit. Coordination and gait normal.  Skin: Skin is warm and dry.  Psychiatric: He has a normal mood and affect.    ED Course  Procedures (including critical care time) Labs Review Labs Reviewed - No data to display  Imaging Review Dg Hip Bilateral W/pelvis  11/19/2013   CLINICAL DATA:  Injury to the left hip while bowling yesterday. Left hip and left flank pain.  EXAM: BILATERAL HIP WITH PELVIS - 4+ VIEW  COMPARISON:  None.  FINDINGS: No evidence of acute fracture or dislocation. Joint spaces in both hips well-preserved. Bone mineral density well-preserved. Well corticated calcification adjacent to the lesser trochanter of the right femur.  Included AP pelvis demonstrates intact sacroiliac joints and symphysis pubis. No intrinsic osseous abnormality.  IMPRESSION: 1. No acute osseous abnormality. 2. 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   Electronically Signed   By: Hulan Saashomas  Lawrence M.D.   On: 11/19/2013 17:07   Dg Finger Ring Right  11/19/2013   CLINICAL DATA:  Right ring finger pain after hitting finger while cleaning bowling ball  EXAM: RIGHT RING FINGER 2+V  COMPARISON:  None.  FINDINGS: There is no evidence of fracture or dislocation. There is no evidence of arthropathy or other focal bone abnormality. Soft tissues are unremarkable.  IMPRESSION: No acute fracture or dislocation.   Electronically Signed   By: Sherian ReinWei-Chen  Lin M.D.   On: 11/19/2013 17:03     EKG Interpretation None      MDM   Final diagnoses:  Hip sprain, left, initial encounter  Finger sprain, initial encounter    Patients labs and/or radiological studies were viewed and considered during the medical decision making and disposition process.  xrays negative and reassuring.  Pt advised ice therapy, rest. Naproxen. F/u with pcp if sx not improved over the next week.  Finger splint  applied.    Burgess AmorJulie Thelbert Gartin, PA-C 11/19/13 1714

## 2013-11-19 NOTE — ED Notes (Signed)
Pt c/o pain to left flank and right ring finger after twisting the wrong way bowling last night.

## 2013-11-19 NOTE — ED Provider Notes (Signed)
Medical screening examination/treatment/procedure(s) were performed by non-physician practitioner and as supervising physician I was immediately available for consultation/collaboration.     Geoffery Lyonsouglas Tazaria Dlugosz, MD 11/19/13 1726

## 2013-11-19 NOTE — Discharge Instructions (Signed)
Joint Sprain A sprain is a stretch in the ligaments that hold a joint together. Severe sprains may need as long as 3-6 weeks of immobilization and/or exercises to heal completely. Sprained joints should be rested and protected. If not, they can become unstable and prone to re-injury. Proper treatment can reduce your pain, shorten the period of disability, and reduce the risk of repeated injuries. TREATMENT   Rest and elevate the injured joint to reduce pain and swelling.  Apply ice packs to the injury for 20-30 minutes every 2-3 hours for the next 2-3 days.  Keep the injury wrapped in a compression bandage or splint as long as the joint is painful or as instructed by your caregiver.  Do not use the injured joint until it is completely healed to prevent re-injury and chronic instability. Follow the instructions of your caregiver.  Long-term sprain management may require exercises and/or treatment by a physical therapist. Taping or special braces may help stabilize the joint until it is completely better. SEEK MEDICAL CARE IF:   You develop increased pain or swelling of the joint.  You develop increasing redness and warmth of the joint.  You develop a fever.  It becomes stiff.  Your hand or foot gets cold or numb. Document Released: 03/06/2004 Document Revised: 04/21/2011 Document Reviewed: 02/14/2008 [facility] Patient Information 2015 [place], [place]. This information is not intended to replace advice given to you by your health care provider. Make sure you discuss any questions you have with your health care provider.   Add the anti inflammatory medication to your current pain regimen. Ice and elevate your finger as much as possible and use the finger splint provided to rest and protect the joint. Ice your hip also as much as possible.  Expect your injuries to heal with time over the next week, see your doctor for a recheck if not improving.  Your xrays to day are negative for acute  injury.

## 2013-11-21 ENCOUNTER — Other Ambulatory Visit: Payer: Medicare Other

## 2014-01-18 ENCOUNTER — Ambulatory Visit
Admission: RE | Admit: 2014-01-18 | Discharge: 2014-01-18 | Disposition: A | Discharge status: Home / Self Care | Payer: Medicare Other | Source: Ambulatory Visit | Attending: Neurology | Admitting: Neurology

## 2014-01-18 DIAGNOSIS — M545 Low back pain, unspecified: Secondary | ICD-10-CM

## 2014-01-20 ENCOUNTER — Emergency Department: Payer: Self-pay | Admitting: Emergency Medicine

## 2014-07-09 ENCOUNTER — Encounter: Payer: Self-pay | Admitting: Emergency Medicine

## 2014-07-09 ENCOUNTER — Emergency Department
Admission: EM | Admit: 2014-07-09 | Discharge: 2014-07-09 | Disposition: A | Discharge status: Home / Self Care | Payer: Medicare Other | Attending: Emergency Medicine | Admitting: Emergency Medicine

## 2014-07-09 DIAGNOSIS — Z79899 Other long term (current) drug therapy: Secondary | ICD-10-CM | POA: Diagnosis not present

## 2014-07-09 DIAGNOSIS — Z791 Long term (current) use of non-steroidal anti-inflammatories (NSAID): Secondary | ICD-10-CM | POA: Insufficient documentation

## 2014-07-09 DIAGNOSIS — I10 Essential (primary) hypertension: Secondary | ICD-10-CM | POA: Insufficient documentation

## 2014-07-09 DIAGNOSIS — R21 Rash and other nonspecific skin eruption: Secondary | ICD-10-CM | POA: Diagnosis present

## 2014-07-09 DIAGNOSIS — B356 Tinea cruris: Secondary | ICD-10-CM

## 2014-07-09 MED ORDER — KETOCONAZOLE 2 % EX CREA: 1.0000 "application " | TOPICAL_CREAM | Freq: Two times a day (BID) | CUTANEOUS | Status: AC

## 2014-07-09 NOTE — ED Notes (Signed)
Pt states that he has a rash in his "private area". Denies any discharge.

## 2014-07-09 NOTE — Discharge Instructions (Signed)
Jock Itch Jock itch is a fungal infection of the skin in the groin area. It is sometimes called "ringworm" even though it is not caused by a worm. A fungus is a type of germ that thrives in dark, damp places.  CAUSES  This infection may spread from:  A fungus infection elsewhere on the body (such as athlete's foot).  Sharing towels or clothing. This infection is more common in:  Hot, humid climates.  People who wear tight-fitting clothing or wet bathing suits for long periods of time.  Athletes.  Overweight people.  People with diabetes. SYMPTOMS  Jock itch causes the following symptoms:  Red, pink or brown rash in the groin. Rash may spread to the thighs, anus, and buttocks.  Itching. DIAGNOSIS  Your caregiver may make the diagnosis by looking at the rash. Sometimes a skin scraping will be sent to test for fungus. Testing can be done either by looking under the microscope or by doing a culture (test to try to grow the fungus). A culture can take up to 2 weeks to come back. TREATMENT  Jock itch may be treated with:  Skin cream or ointment to kill fungus.  Medicine by mouth to kill fungus.  Skin cream or ointment to calm the itching.  Compresses or medicated powders to dry the infected skin. HOME CARE INSTRUCTIONS   Be sure to treat the rash completely. Follow your caregiver's instructions. It can take a couple of weeks to treat. If you do not treat the infection long enough, the rash can come back.  Wear loose-fitting clothing.  Men should wear cotton boxer shorts.  Women should wear cotton underwear.  Avoid hot baths.  Dry the groin area well after bathing. SEEK MEDICAL CARE IF:   Your rash is worse.  Your rash is spreading.  Your rash returns after treatment is finished.  Your rash is not gone in 4 weeks. Fungal infections are slow to respond to treatment. Some redness may remain for several weeks after the fungus is gone. SEEK IMMEDIATE MEDICAL CARE  IF:  The area becomes red, warm, tender, and swollen.  You have a fever. Document Released: 01/17/2002 Document Revised: 04/21/2011 Document Reviewed: 12/17/2007 [facility]ExitCare Patient Information 2015 [place]ExitCare, [place]LL[place]. This information is not intended to replace advice given to you by your health care provider. Make sure you discuss any questions you have with your health care provider.  Use the prescription cream as directed.  Follow-up with your provider for continued symptoms.

## 2014-07-09 NOTE — ED Provider Notes (Signed)
[facility] Emergency Department Provider Note ?____________________________________________ ? Time seen: 1818 ? I have reviewed the triage vital signs and the nursing notes. ________ HISTORY ? Chief Complaint Rash  HPI  Jesse Ellis is a 34 y.o. male who reports to the ED with a 2 day complaint of itchy rash to the shaved area of his groin. He denies any other contacts, exposure, or lesions. He notes an itchy red rash primarily to the scrotum. He does admit to regularly shaving this area. He is applied and about on it without improvement of his symptoms. He is here for evaluation management.  Past Medical History  Diagnosis Date  . Chronic back pain     "chronic skeletal problems"  . PTSD (post-traumatic stress disorder)   . Anxiety   . Depression   . Hypertension    There are no active problems to display for this patient.  ? History reviewed. No pertinent past surgical history. ? Current Outpatient Rx  Name  Route  Sig  Dispense  Refill  . Cyanocobalamin 2500 MCG CHEW   Oral   Chew 1 tablet by mouth daily.         . divalproex (DEPAKOTE ER) 500 MG 24 hr tablet   Oral   Take 1 tablet by mouth at bedtime.         . gabapentin (NEURONTIN) 400 MG capsule   Oral   Take 800 mg by mouth 2 (two) times daily.          . haloperidol (HALDOL) 10 MG tablet   Oral   Take 10 mg by mouth daily as needed (for aggrssion or seizures).         Marland Kitchen ketoconazole (NIZORAL) 2 % cream   Topical   Apply 1 application topically 2 (two) times daily.   30 g   0   . metoprolol tartrate (LOPRESSOR) 25 MG tablet   Oral   Take 25 mg by mouth 2 (two) times daily.         . naproxen (NAPROSYN) 500 MG tablet   Oral   Take 1 tablet (500 mg total) by mouth 2 (two) times daily.   30 tablet   0   . Oxycodone HCl 10 MG TABS   Oral   Take 10 mg by mouth 2 (two) times daily.          Marland Kitchen topiramate (TOPAMAX) 25 MG tablet   Oral   Take 25 mg by mouth daily.         . traZODone (DESYREL) 50 MG tablet   Oral   Take 150 tablets by mouth at bedtime.           Allergies Sumac ? No family history on file. ? Social History History  Substance Use Topics  . Smoking status: Never Smoker   . Smokeless tobacco: Not on file  . Alcohol Use: No   Review of Systems  Constitutional: Negative for fever. HEENT: Negative for head trauma, visual changes, sore throat. Cardiovascular: Negative for chest pain. Respiratory: Negative for shortness of breath. Musculoskeletal: Negative for back pain. Genitourinary: Negative for dysuria or penile discharge. Skin: Positive for rash. Neurological: Negative for headaches, focal weakness or numbness.  10-point ROS otherwise negative. ____________________________________________  PHYSICAL EXAM:  VITAL SIGNS: ED Triage Vitals  Enc Vitals Group     BP 07/09/14 1615 137/80 mmHg     Pulse Rate 07/09/14 1615 83     Resp 07/09/14 1615 20  Temp 07/09/14 1615 98.2 F (36.8 C)     Temp Source 07/09/14 1615 Oral     SpO2 07/09/14 1615 98 %     Weight 07/09/14 1615 325 lb (147.419 kg)     Height 07/09/14 1615 5\' 11"  (1.803 m)     Head Cir --      Peak Flow --      Pain Score 07/09/14 1616 0     Pain Loc --      Pain Edu? --      Excl. in GC? --    Constitutional: Alert and oriented. Well appearing and in no distress. HEENT:Normocephalic and atraumatic.  PERRL. Normal extraocular movements.   Respiratory: Normal respiratory effort without tachypnea.  Musculoskeletal: Nontender with normal range of motion in all extremities.  Genitourinary: Normal external exam except for red, papular rash to the scrotum.  Neurologic:  Normal speech and language. CN II-XII grossly intact. No gait instability. Skin:  Skin is warm, dry and intact. No rash noted. Psychiatric: Mood and affect are normal. Patient exhibits appropriate insight and judgment. ______________________________________________________ INITIAL  IMPRESSION / ASSESSMENT AND PLAN / ED COURSE ? Jock itch. Suggest treatment to topical antifungal, ketoconazole ointment. Follow-up with primary provider as needed. Keep the area dry as discussed.  ____________________________________________ FINAL CLINICAL IMPRESSION(S) / ED DIAGNOSES?  Final diagnoses:  Jock itch      Lissa HoardJenise V Bacon Zyia Kaneko, PA-C 07/09/14 1834

## 2014-07-10 ENCOUNTER — Emergency Department
Admission: EM | Admit: 2014-07-10 | Discharge: 2014-07-10 | Disposition: A | Discharge status: Home / Self Care | Payer: Medicare Other | Attending: Emergency Medicine | Admitting: Emergency Medicine

## 2014-07-10 ENCOUNTER — Encounter: Payer: Self-pay | Admitting: Emergency Medicine

## 2014-07-10 DIAGNOSIS — N4889 Other specified disorders of penis: Secondary | ICD-10-CM | POA: Diagnosis present

## 2014-07-10 DIAGNOSIS — N481 Balanitis: Secondary | ICD-10-CM | POA: Diagnosis not present

## 2014-07-10 DIAGNOSIS — Z79899 Other long term (current) drug therapy: Secondary | ICD-10-CM | POA: Diagnosis not present

## 2014-07-10 MED ORDER — CEPHALEXIN 500 MG PO CAPS: 500.0000 mg | ORAL_CAPSULE | Freq: Four times a day (QID) | ORAL | Status: AC

## 2014-07-10 MED ORDER — TERBINAFINE HCL 1 % EX CREA: 1.0000 "application " | TOPICAL_CREAM | Freq: Two times a day (BID) | CUTANEOUS | Status: AC

## 2014-07-10 NOTE — Discharge Instructions (Signed)
Balanitis  Balanitis is inflammation of the head of the penis (glans).   CAUSES   Balanitis has multiple causes, both infectious and noninfectious. Often balanitis is the result of poor personal hygiene, especially in uncircumcised males. Without adequate washing, viruses, bacteria, and yeast collect between the foreskin and the glans. This can cause an infection. Lack of air and irritation from a normal secretion called smegma contribute to the cause in uncircumcised males. Other causes include:   Chemical irritation from the use of certain soaps and shower gels (especially soaps with perfumes), condoms, personal lubricants, petroleum jelly, spermicides, and fabric conditioners.   Skin conditions, such as eczema, dermatitis, and psoriasis.   Allergies to drugs, such as tetracycline and sulfa.   Certain medical conditions, including liver cirrhosis, congestive heart failure, and kidney disease.   Morbid obesity.  RISK FACTORS   Diabetes mellitus.   A tight foreskin that is difficult to pull back past the glans (phimosis).   Sex without the use of a condom.  SIGNS AND SYMPTOMS   Symptoms may include:   Discharge coming from under the foreskin.   Tenderness.   Itching and inability to get an erection (because of the pain).   Redness and a rash.   Sores on the glans and on the foreskin.  DIAGNOSIS  Diagnosis of balanitis is confirmed through a physical exam.  TREATMENT  The treatment is based on the cause of the balanitis. Treatment may include:   Frequent cleansing.   Keeping the glans and foreskin dry.   Use of medicines such as creams, pain medicines, antibiotics, or medicines to treat fungal infections.   Sitz baths.  If the irritation has caused a scar on the foreskin that prevents easy retraction, a circumcision may be recommended.   HOME CARE INSTRUCTIONS   Sex should be avoided until the condition has cleared.  MAKE SURE YOU:   Understand these instructions.   Will watch your  condition.   Will get help right away if you are not doing well or get worse.  Document Released: 06/15/2008 Document Revised: 02/01/2013 Document Reviewed: 07/19/2012  ExitCare Patient Information 2015 [employer], [employer]. This information is not intended to replace advice given to you by your health care provider. Make sure you discuss any questions you have with your health care provider.

## 2014-07-10 NOTE — ED Notes (Signed)
Patient c/o itching and swelling around head of penis.  No discharge reported.

## 2014-07-10 NOTE — ED Notes (Signed)
C/o swelling and itching to penis, denies any pain or discharge

## 2014-07-10 NOTE — ED Provider Notes (Signed)
[facility] Emergency Department Provider Note  ____________________________________________  Time seen: Approximately 2:36 PM  I have reviewed the triage vital signs and the nursing notes.   HISTORY  Chief Complaint Wound Infection    HPI Jesse Ellis is a 34 y.o. male presents with complaints of increased swelling around the head of his penis 2 days. Denies any trauma denies any discharge. Denies any problems with urination. Denies any dysuria.No pain, just swelling. 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   Past Medical History  Diagnosis Date  . Chronic back pain     "chronic skeletal problems"  . PTSD (post-traumatic stress disorder)   . Anxiety   . Depression   . Hypertension     There are no active problems to display for this patient.   History reviewed. No pertinent past surgical history.  Current Outpatient Rx  Name  Route  Sig  Dispense  Refill  . cephALEXin (KEFLEX) 500 MG capsule   Oral   Take 1 capsule (500 mg total) by mouth 4 (four) times daily.   40 capsule   0   . Cyanocobalamin 2500 MCG CHEW   Oral   Chew 1 tablet by mouth daily.         . divalproex (DEPAKOTE ER) 500 MG 24 hr tablet   Oral   Take 1 tablet by mouth at bedtime.         . gabapentin (NEURONTIN) 400 MG capsule   Oral   Take 800 mg by mouth 2 (two) times daily.          . haloperidol (HALDOL) 10 MG tablet   Oral   Take 10 mg by mouth daily as needed (for aggrssion or seizures).         Marland Kitchen ketoconazole (NIZORAL) 2 % cream   Topical   Apply 1 application topically 2 (two) times daily.   30 g   0   . metoprolol tartrate (LOPRESSOR) 25 MG tablet   Oral   Take 25 mg by mouth 2 (two) times daily.         . naproxen (NAPROSYN) 500 MG tablet   Oral   Take 1 tablet (500 mg total) by mouth 2 (two) times daily.   30 tablet   0   . Oxycodone HCl 10 MG  TABS   Oral   Take 10 mg by mouth 2 (two) times daily.          Marland Kitchen terbinafine (LAMISIL) 1 % cream   Topical   Apply 1 application topically 2 (two) times daily.   30 g   0   . topiramate (TOPAMAX) 25 MG tablet   Oral   Take 25 mg by mouth daily.         . traZODone (DESYREL) 50 MG tablet   Oral   Take 150 tablets by mouth at bedtime.            Allergies Sumac  No family history on file.  Social History History  Substance Use Topics  . Smoking status: Never Smoker   . Smokeless tobacco: Not on file  . Alcohol Use: No    Review of Systems Constitutional: No fever/chills Eyes: No visual changes. ENT: No sore throat. Cardiovascular: Denies chest pain. Respiratory: Denies shortness of breath. Gastrointestinal: No abdominal pain.  No nausea, no vomiting.  No diarrhea.  No constipation. Genitourinary: Negative for dysuria. Positive for penile swelling Musculoskeletal: Negative for back pain. Skin: Negative for rash. Neurological: Negative for headaches, focal weakness or numbness.  10-point ROS otherwise negative.  ____________________________________________   PHYSICAL EXAM:  VITAL SIGNS: ED Triage Vitals  Enc Vitals Group     BP 07/10/14 1418 129/65 mmHg     Pulse Rate 07/10/14 1418 101     Resp 07/10/14 1418 18     Temp 07/10/14 1418 98.5 F (36.9 C)     Temp Source 07/10/14 1418 Oral     SpO2 07/10/14 1418 95 %     Weight 07/10/14 1418 325 lb (147.419 kg)     Height 07/10/14 1418 5\' 11"  (1.803 m)     Head Cir --      Peak Flow --      Pain Score 07/10/14 1419 0     Pain Loc --      Pain Edu? --      Excl. in GC? --     Constitutional: Alert and oriented. Well appearing and in no acute distress. Eyes: Conjunctivae are normal. PERRL. EOMI. Head: Atraumatic. Nose: No congestion/rhinnorhea. Mouth/Throat: Mucous membranes are moist.  Oropharynx non-erythematous. Neck: No stridor.   Cardiovascular: Normal rate, regular rhythm. Grossly normal  heart sounds.  Good peripheral circulation. Respiratory: Normal respiratory effort.  No retractions. Lungs CTAB. Gastrointestinal: Soft and nontender. No distention. No abdominal bruits. No CVA tenderness. Genitourinary: Complains of the penis is definitely swollen nontender. No evidence of ecchymosis. No discharge from urethra. Musculoskeletal: No lower extremity tenderness nor edema.  No joint effusions. Neurologic:  Normal speech and language. No gross focal neurologic deficits are appreciated. Speech is normal. No gait instability. Skin:  Skin is warm, dry and intact. No rash noted. Psychiatric: Mood and affect are normal. Speech and behavior are normal.  ____________________________________________   LABS (all labs ordered are listed, but only abnormal results are displayed)  Labs Reviewed - No data to display ____________________________________________  EKG  None ____________________________________________  RADIOLOGY  None ____________________________________________   PROCEDURES  Procedure(s) performed: None  Critical Care performed: No  ____________________________________________   INITIAL IMPRESSION / ASSESSMENT AND PLAN / ED COURSE  Pertinent labs & imaging results that were available during my care of the patient were reviewed by me and considered in my medical decision making (see chart for details).  Presents with complaints of swollen penis. Diagnosis is balanitis. We'll treat with antibiotics and antifungal cream. He understands to return to the ER if symptoms worsen. No other emergency medical complaints at this time. ____________________________________________   FINAL CLINICAL IMPRESSION(S) / ED DIAGNOSES  Final diagnoses:  Balanitis      Evangeline Dakinharles M Teddrick Mallari, PA-C 07/10/14 1529  Phineas SemenGraydon Goodman, MD 07/10/14 [phone]

## 2014-07-20 NOTE — ED Provider Notes (Signed)
Medical screening examination/treatment/procedure(s) were performed by non-physician practitioner and as supervising physician I was immediately available for consultation/collaboration.    Roxene Alviar E Irish Breisch, MD 07/20/14 1059
